# Patient Record
Sex: Female | Born: 1995 | Race: Black or African American | Hispanic: No | Marital: Single | Smoking: Current every day smoker
Health system: Southern US, Community
[De-identification: ages and names within clinical notes are randomized; demographics above are authoritative.]

## PROBLEM LIST (undated history)

## (undated) DIAGNOSIS — J45909 Unspecified asthma, uncomplicated: Secondary | ICD-10-CM

## (undated) DIAGNOSIS — F32A Depression, unspecified: Secondary | ICD-10-CM

## (undated) DIAGNOSIS — F329 Major depressive disorder, single episode, unspecified: Secondary | ICD-10-CM

## (undated) HISTORY — PX: TONSILLECTOMY: SUR1361

## (undated) HISTORY — PX: TYMPANOSTOMY: SHX2586

## (undated) HISTORY — PX: ADENOIDECTOMY: SUR15

---
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
## 2014-06-01 ENCOUNTER — Encounter (HOSPITAL_COMMUNITY): Payer: Self-pay | Admitting: Obstetrics and Gynecology

## 2015-10-10 ENCOUNTER — Emergency Department (HOSPITAL_COMMUNITY): Payer: Medicaid Other

## 2015-10-10 ENCOUNTER — Emergency Department (HOSPITAL_COMMUNITY)
Admission: EM | Admit: 2015-10-10 | Discharge: 2015-10-10 | Disposition: A | Discharge status: Home / Self Care | Payer: Medicaid Other | Attending: Emergency Medicine | Admitting: Emergency Medicine

## 2015-10-10 ENCOUNTER — Encounter (HOSPITAL_COMMUNITY): Payer: Self-pay | Admitting: Emergency Medicine

## 2015-10-10 DIAGNOSIS — R109 Unspecified abdominal pain: Secondary | ICD-10-CM

## 2015-10-10 DIAGNOSIS — Z3202 Encounter for pregnancy test, result negative: Secondary | ICD-10-CM | POA: Insufficient documentation

## 2015-10-10 DIAGNOSIS — Z87891 Personal history of nicotine dependence: Secondary | ICD-10-CM | POA: Diagnosis not present

## 2015-10-10 DIAGNOSIS — R1011 Right upper quadrant pain: Secondary | ICD-10-CM | POA: Diagnosis not present

## 2015-10-10 DIAGNOSIS — J45909 Unspecified asthma, uncomplicated: Secondary | ICD-10-CM | POA: Insufficient documentation

## 2015-10-10 DIAGNOSIS — Z8659 Personal history of other mental and behavioral disorders: Secondary | ICD-10-CM | POA: Diagnosis not present

## 2015-10-10 LAB — COMPREHENSIVE METABOLIC PANEL
ALBUMIN: 3.4 g/dL — AB (ref 3.5–5.0)
ALT: 10 U/L — AB (ref 14–54)
AST: 13 U/L — AB (ref 15–41)
Alkaline Phosphatase: 61 U/L (ref 38–126)
Anion gap: 11 (ref 5–15)
BUN: 8 mg/dL (ref 6–20)
CHLORIDE: 104 mmol/L (ref 101–111)
CO2: 25 mmol/L (ref 22–32)
Calcium: 9 mg/dL (ref 8.9–10.3)
Creatinine, Ser: 0.72 mg/dL (ref 0.44–1.00)
GFR calc Af Amer: >60 mL/min (ref >60)
GFR calc non Af Amer: >60 mL/min (ref >60)
GLUCOSE: 101 mg/dL — AB (ref 65–99)
POTASSIUM: 4.3 mmol/L (ref 3.5–5.1)
Sodium: 140 mmol/L (ref 135–145)
Total Bilirubin: 0.4 mg/dL (ref 0.3–1.2)
Total Protein: 7.2 g/dL (ref 6.5–8.1)

## 2015-10-10 LAB — URINALYSIS, ROUTINE W REFLEX MICROSCOPIC
BILIRUBIN URINE: NEGATIVE
GLUCOSE, UA: NEGATIVE mg/dL
KETONES UR: NEGATIVE mg/dL
Nitrite: NEGATIVE
PH: 6 (ref 5.0–8.0)
Protein, ur: 30 mg/dL — AB
Specific Gravity, Urine: 1.028 (ref 1.005–1.030)

## 2015-10-10 LAB — URINE MICROSCOPIC-ADD ON

## 2015-10-10 LAB — CBC
HEMATOCRIT: 34.1 % — AB (ref 36.0–46.0)
Hemoglobin: 10.4 g/dL — ABNORMAL LOW (ref 12.0–15.0)
MCH: 24.7 pg — AB (ref 26.0–34.0)
MCHC: 30.5 g/dL (ref 30.0–36.0)
MCV: 81 fL (ref 78.0–100.0)
Platelets: 218 10*3/uL (ref 150–400)
RBC: 4.21 MIL/uL (ref 3.87–5.11)
RDW: 14.8 % (ref 11.5–15.5)
WBC: 12.1 10*3/uL — ABNORMAL HIGH (ref 4.0–10.5)

## 2015-10-10 LAB — I-STAT BETA HCG BLOOD, ED (MC, WL, AP ONLY): I-stat hCG, quantitative: <5 m[IU]/mL (ref <5)

## 2015-10-10 LAB — LIPASE, BLOOD: LIPASE: 25 U/L (ref 11–51)

## 2015-10-10 MED ORDER — IOHEXOL 300 MG/ML  SOLN
25.0000 mL | Freq: Once | INTRAMUSCULAR | Status: AC | PRN
  Administered 2015-10-10: 25 mL via ORAL

## 2015-10-10 MED ORDER — IOHEXOL 300 MG/ML  SOLN
100.0000 mL | Freq: Once | INTRAMUSCULAR | Status: AC | PRN
  Administered 2015-10-10: 100 mL via INTRAVENOUS

## 2015-10-10 NOTE — Discharge Instructions (Signed)

## 2015-10-10 NOTE — ED Notes (Signed)
Pt. reports RLQ pain onset last week worse when lying on bed , pt. stated " feels like gas" tried Pepto Bismol with no relief.

## 2015-10-10 NOTE — ED Provider Notes (Signed)
CSN: [id number]     Arrival date & time 10/10/15  0025 History   First MD Initiated Contact with Patient 10/10/15 0720     Chief Complaint  Patient presents with  . Abdominal Pain     (Consider location/radiation/quality/duration/timing/severity/associated sxs/prior Treatment) HPI Comments: Patient here complaining of one week of abdominal pain localized to her right upper right lower quadrant. Pain characterized as sharp and was initially constant and now waxing and waning. No associated fever, vomiting, diarrhea. No vaginal bleeding or discharge. No dysuria or hematuria. Some association with food. Pain is now localized the past when flowers to the right upper quadrant with radiation to her back. Denies any prior history of gallbladder disease. Last menstrual period was 2 weeks ago. No treatment used prior to arrival  Patient is a 20 y.o. female presenting with abdominal pain. The history is provided by the patient.  Abdominal Pain   Past Medical History  Diagnosis Date  . Asthma   . Depression   . Morbid obesity [facility](HCC)    Past Surgical History  Procedure Laterality Date  . Tonsillectomy    . Tympanostomy    . Adenoidectomy    . Cesarean section N/A 10/09/2013    Procedure: CESAREAN SECTION;  Surgeon: Esmeralda ArthurSandra A Rivard, MD;  Location: [facility];  Service: Obstetrics;  Laterality: N/A;   No family history on file. Social History  Substance Use Topics  . Smoking status: Former Smoker    Types: Cigarettes  . Smokeless tobacco: Never Used  . Alcohol Use: No   OB History    Gravida Para Term Preterm AB TAB SAB Ectopic Multiple Living   1 1 1       1      Review of Systems  Gastrointestinal: Positive for abdominal pain.  All other systems reviewed and are negative.     Allergies  Review of patient's allergies indicates no known allergies.  Home Medications   Prior to Admission medications   Medication Sig Start Date End Date Taking? Authorizing Provider  ibuprofen  (ADVIL,MOTRIN) 600 MG tablet Take 1 tablet (600 mg total) by mouth every 6 (six) hours. Patient not taking: Reported on 10/10/2015 10/12/13   Nigel BridgemanVicki Latham, CNM  norgestimate-ethinyl estradiol (SPRINTEC 28) 0.25-35 MG-MCG tablet Take 1 tablet by mouth daily. Patient not taking: Reported on 10/10/2015 10/12/13   Nigel BridgemanVicki Latham, CNM  oxyCODONE-acetaminophen (PERCOCET/ROXICET) 5-325 MG per tablet Take 1-2 tablets by mouth every 4 (four) hours as needed for severe pain (moderate - severe pain). Patient not taking: Reported on 10/10/2015 10/12/13   Nigel BridgemanVicki Latham, CNM   BP 136/79 mmHg  Pulse 106  Temp(Src) 99.1 F (37.3 C) (Oral)  Resp 18  Ht 5\' 7"  (1.702 m)  Wt 149.744 kg  BMI 51.69 kg/m2  SpO2 100% Physical Exam  Constitutional: She is oriented to person, place, and time. She appears well-developed and well-nourished.  Non-toxic appearance. No distress.  HENT:  Head: Normocephalic and atraumatic.  Eyes: Conjunctivae, EOM and lids are normal. Pupils are equal, round, and reactive to light.  Neck: Normal range of motion. Neck supple. No tracheal deviation present. No thyroid mass present.  Cardiovascular: Normal rate, regular rhythm and normal heart sounds.  Exam reveals no gallop.   No murmur heard. Pulmonary/Chest: Effort normal and breath sounds normal. No stridor. No respiratory distress. She has no decreased breath sounds. She has no wheezes. She has no rhonchi. She has no rales.  Abdominal: Soft. Normal appearance and bowel sounds are normal. She exhibits  no distension. There is tenderness in the right upper quadrant. There is no rigidity, no rebound, no guarding and no CVA tenderness.    Musculoskeletal: Normal range of motion. She exhibits no edema or tenderness.  Neurological: She is alert and oriented to person, place, and time. She has normal strength. No cranial nerve deficit or sensory deficit. GCS eye subscore is 4. GCS verbal subscore is 5. GCS motor subscore is 6.  Skin: Skin is warm  and dry. No abrasion and no rash noted.  Psychiatric: She has a normal mood and affect. Her speech is normal and behavior is normal.  Nursing note and vitals reviewed.   ED Course  Procedures (including critical care time) Labs Review Labs Reviewed  COMPREHENSIVE METABOLIC PANEL - Abnormal; Notable for the following:    Glucose, Bld 101 (*)    Albumin 3.4 (*)    AST 13 (*)    ALT 10 (*)    All other components within normal limits  CBC - Abnormal; Notable for the following:    WBC 12.1 (*)    Hemoglobin 10.4 (*)    HCT 34.1 (*)    MCH 24.7 (*)    All other components within normal limits  URINALYSIS, ROUTINE W REFLEX MICROSCOPIC (NOT AT [facility]) - Abnormal; Notable for the following:    APPearance TURBID (*)    Hgb urine dipstick LARGE (*)    Protein, ur 30 (*)    Leukocytes, UA LARGE (*)    All other components within normal limits  URINE MICROSCOPIC-ADD ON - Abnormal; Notable for the following:    Squamous Epithelial / LPF 6-30 (*)    Bacteria, UA MANY (*)    All other components within normal limits  LIPASE, BLOOD  I-STAT BETA HCG BLOOD, ED ([facility], [facility], [facility] ONLY)    Imaging Review No results found. I have personally reviewed and evaluated these images and lab results as part of my medical decision-making.   EKG Interpretation None      MDM   Final diagnoses:  None    Patient is urine likely contaminated. Urine culture will be sent. Abdominal CT as well as pelvic ultrasound negative. Patient's abdominal exam repeated prior to discharge and remains benign. Follow-up instructions as well as return precautions given    Lorre Nick, MD 10/10/15 1224

## 2015-10-12 LAB — URINE CULTURE

## 2016-01-06 ENCOUNTER — Ambulatory Visit (HOSPITAL_COMMUNITY)
Admission: EM | Admit: 2016-01-06 | Discharge: 2016-01-06 | Disposition: A | Discharge status: Home / Self Care | Payer: Medicaid Other | Attending: Emergency Medicine | Admitting: Emergency Medicine

## 2016-01-06 ENCOUNTER — Encounter (HOSPITAL_COMMUNITY): Payer: Self-pay | Admitting: *Deleted

## 2016-01-06 DIAGNOSIS — H7291 Unspecified perforation of tympanic membrane, right ear: Secondary | ICD-10-CM | POA: Diagnosis not present

## 2016-01-06 DIAGNOSIS — R0982 Postnasal drip: Secondary | ICD-10-CM

## 2016-01-06 DIAGNOSIS — H9201 Otalgia, right ear: Secondary | ICD-10-CM | POA: Diagnosis not present

## 2016-01-06 NOTE — ED Provider Notes (Signed)
CSN: [id number]     Arrival date & time 01/06/16  1805 History   First MD Initiated Contact with Patient 01/06/16 1835     Chief Complaint  Patient presents with  . Otalgia   (Consider location/radiation/quality/duration/timing/severity/associated sxs/prior Treatment) HPI Comments: 20 year old obese female complaining of right ear discomfort. She states that yesterday she had some pain in the right ear associated with decreased hearing and draining. Today the ear feels a little better. She is also having PND and minor cough that she is aware is due to allergies.  When the patient was advised of the rupture of the TM she states that she was told by her ENT doctor a couple years ago that this existed.   Past Medical History  Diagnosis Date  . Asthma   . Depression   . Morbid obesity [facility](HCC)    Past Surgical History  Procedure Laterality Date  . Tonsillectomy    . Tympanostomy    . Adenoidectomy    . Cesarean section N/A 10/09/2013    Procedure: CESAREAN SECTION;  Surgeon: Esmeralda ArthurSandra A Rivard, MD;  Location: [facility];  Service: Obstetrics;  Laterality: N/A;   History reviewed. No pertinent family history. Social History  Substance Use Topics  . Smoking status: Former Smoker    Types: Cigarettes  . Smokeless tobacco: Never Used  . Alcohol Use: No   OB History    Gravida Para Term Preterm AB TAB SAB Ectopic Multiple Living   1 1 1       1      Review of Systems  Constitutional: Negative for fever, chills, activity change, appetite change and fatigue.  HENT: Positive for congestion, ear pain, postnasal drip, rhinorrhea and sore throat. Negative for facial swelling.   Eyes: Negative.   Respiratory: Positive for cough. Negative for shortness of breath.   Cardiovascular: Negative.   Musculoskeletal: Negative for neck pain and neck stiffness.  Skin: Negative for pallor and rash.  Neurological: Negative.   All other systems reviewed and are negative.   Allergies  Review of patient's  allergies indicates no known allergies.  Home Medications   Prior to Admission medications   Medication Sig Start Date End Date Taking? Authorizing Provider  ibuprofen (ADVIL,MOTRIN) 600 MG tablet Take 1 tablet (600 mg total) by mouth every 6 (six) hours. Patient not taking: Reported on 10/10/2015 10/12/13   Nigel BridgemanVicki Latham, CNM  norgestimate-ethinyl estradiol (SPRINTEC 28) 0.25-35 MG-MCG tablet Take 1 tablet by mouth daily. Patient not taking: Reported on 10/10/2015 10/12/13   Nigel BridgemanVicki Latham, CNM  oxyCODONE-acetaminophen (PERCOCET/ROXICET) 5-325 MG per tablet Take 1-2 tablets by mouth every 4 (four) hours as needed for severe pain (moderate - severe pain). Patient not taking: Reported on 10/10/2015 10/12/13   Nigel BridgemanVicki Latham, CNM   Meds Ordered and Administered this Visit  Medications - No data to display  BP 155/91 mmHg  Pulse 108  Temp(Src) 99.3 F (37.4 C) (Oral)  Resp 12  SpO2 99%  LMP 12/08/2015 No data found.   Physical Exam  Constitutional: She is oriented to person, place, and time. She appears well-developed and well-nourished. No distress.  HENT:  Mouth/Throat: No oropharyngeal exudate.  Left TM normal. Right TM pearly gray and without erythema. The inferior portion of the TM has a relatively large triangular shape orifice allowing visualization of a portion of the middle ear. There is no current drainage. No swelling of the EAC.  Oropharynx with minor erythema, cobblestoning and clear PND.  Eyes: Conjunctivae are normal. Left eye  exhibits no discharge.  Neck: Normal range of motion. Neck supple.  Cardiovascular: Normal rate and regular rhythm.   Pulmonary/Chest: Effort normal and breath sounds normal. No respiratory distress. She has no wheezes.  Lymphadenopathy:    She has no cervical adenopathy.  Neurological: She is alert and oriented to person, place, and time.  Skin: Skin is warm.  Nursing note and vitals reviewed.   ED Course  Procedures (including critical care  time)  Labs Review Labs Reviewed - No data to display  Imaging Review No results found.   Visual Acuity Review  Right Eye Distance:   Left Eye Distance:   Bilateral Distance:    Right Eye Near:   Left Eye Near:    Bilateral Near:         MDM   1. PND (post-nasal drip)   2. Otalgia of right ear   3. Ruptured tympanic membrane, right    The rupture of the right TM apparently is pre-existing although may have increased recently if there was an infection causing some drainage. Fortunately the ear discomfort has much improved. At this time we will elect to do nothing. I do not think antibiotics are necessary at this time and no eardrops. Recommend taking Allegra, Claritin or Zyrtec as needed for drainage Fluids.    [name], NP 01/06/16 [id number]

## 2016-01-06 NOTE — ED Notes (Signed)
Pt  Reports  Symptoms  Of r  Earache    With  Some  Drainage  From  The  Affected  Ear       She  Reports   She  Has  Had  Symptoms  Of  A  Cold  Recently         And    She  Has  Slight  Discomfort  In the  Affected  Ear     Pt  Is  Awake  And  Alert  As  Well as  Being oriented

## 2016-01-06 NOTE — Discharge Instructions (Signed)
Earache An earache, also called otalgia, can be caused by many things. Pain from an earache can be sharp, dull, or burning. The pain may be temporary or constant. Earaches can be caused by problems with the ear, such as infection in either the middle ear or the ear canal, injury, impacted ear wax, middle ear pressure, or a foreign body in the ear. Ear pain can also result from problems in other areas. This is called referred pain. For example, pain can come from a sore throat, a tooth infection, or problems with the jaw or the joint between the jaw and the skull (temporomandibular joint, or TMJ). The cause of an earache is not always easy to identify. Watchful waiting may be appropriate for some earaches until a clear cause of the pain can be found. HOME CARE INSTRUCTIONS Watch your condition for any changes. The following actions may help to lessen any discomfort that you are feeling:  Take medicines only as directed by your health care provider. This includes ear drops.  Apply ice to your outer ear to help reduce pain.  Put ice in a plastic bag.  Place a towel between your skin and the bag.  Leave the ice on for 20 minutes, 2-3 times per day.  Do not put anything in your ear other than medicine that is prescribed by your health care provider.  Try resting in an upright position instead of lying down. This may help to reduce pressure in the middle ear and relieve pain.  Chew gum if it helps to relieve your ear pain.  Control any allergies that you have.  Keep all follow-up visits as directed by your health care provider. This is important. SEEK MEDICAL CARE IF:  Your pain does not improve within 2 days.  You have a fever.  You have new or worsening symptoms. SEEK IMMEDIATE MEDICAL CARE IF:  You have a severe headache.  You have a stiff neck.  You have difficulty swallowing.  You have redness or swelling behind your ear.  You have drainage from your ear.  You have hearing  loss.  You feel dizzy.   This information is not intended to replace advice given to you by your health care provider. Make sure you discuss any questions you have with your health care provider.   Document Released: 03/03/2004 Document Revised: 08/07/2014 Document Reviewed: 02/15/2014 Elsevier Interactive Patient Education 2016 [employer]  Eardrum Perforation The eardrum is a thin, round tissue inside the ear. It allows you to hear. The eardrum can get torn (perforated). Eardrums often heal on their own. There is often little or no long-term hearing loss. HOME CARE  Keep your ear dry while it heals. Do not let your head go under water. Do not swim or dive until your doctor says it is okay.  Before you take a bath or shower, do one of these things to keep water out of your ear:  Put a waterproof earplug in your ear.  Put petroleum jelly all over a cotton ball. Put the cotton ball in your ear.  Take medicines only as told by your doctor.  Avoid blowing your nose if you can. If you blow your nose, do it gently.  Continue your normal activities after your eardrum heals. Your doctor will tell you when your eardrum has healed.  Talk to your doctor before you fly on an airplane.  Keep all doctor follow-up visits as told by your doctor. This is important. GET HELP IF:  You  have a fever. GET HELP RIGHT AWAY IF:  You have blood or yellowish-white fluid (pus) coming from your ear.  You feel dizzy or off balance.  You feel sick to your stomach (nauseous), or you throw up (vomit).  You have more pain.   This information is not intended to replace advice given to you by your health care provider. Make sure you discuss any questions you have with your health care provider.   Document Released: 01/04/2010 Document Revised: 08/07/2014 Document Reviewed: 02/23/2014 Elsevier Interactive Patient Education 2016 Else[employer] Inc.  Tympanic Membrane Perforation The eardrum (tympanic  membrane) protects the inner ear from the outside environment. In addition to protection, the eardrum allows you to hear by transmitting sound waves to the bones in your ear and then to the nervous system. The tympanic membrane is easily perforated, which may result in damage to the inner ear. SYMPTOMS   Sometimes there are no symptoms.  Decreased hearing.  Fluid drainage from ear.  Ear pain. CAUSES   Most commonly, a middle ear infection from built-up pressure.  Injury from a cotton swab.  Traumatic injury to the side of the head. RISK INCREASES WITH:  Frequent middle ear infections.  Use of cotton swabs. PREVENTION   Do not use cotton swabs to clean the ear canal.  If you have ear pain or pressure, see your caregiver to rule out an ear infection that needs treatment. TREATMENT  Protecting the inner ear and allowing the membrane to heal on its own is how tympanic membrane rupture is usually treated. Healing may take several weeks. In order to protect the inner ear, do not allow any fluid to enter the ear canal. Avoid being submerged in water. The use of ear drops may prevent an ear infection from developing, but they should be used with caution, as ear drops can also cause damage to the inner ear. It is important to follow up with your caregiver to confirm healing of the tympanic membrane. If the membrane does not heal, permanent hearing loss may occur. To avoid serious complications, tympanic membranes that do not heal on their own are repaired with surgery.   This information is not intended to replace advice given to you by your health care provider. Make sure you discuss any questions you have with your health care provider.   Document Released: 07/17/2005 Document Revised: 10/09/2011 Document Reviewed: 01/27/2015 Elsevier Interactive Patient Education [employer]

## 2016-01-12 ENCOUNTER — Encounter (HOSPITAL_COMMUNITY): Payer: Self-pay | Admitting: Emergency Medicine

## 2016-01-12 ENCOUNTER — Ambulatory Visit (HOSPITAL_COMMUNITY)
Admission: EM | Admit: 2016-01-12 | Discharge: 2016-01-12 | Disposition: A | Discharge status: Home / Self Care | Payer: Medicaid Other | Attending: Emergency Medicine | Admitting: Emergency Medicine

## 2016-01-12 DIAGNOSIS — H7291 Unspecified perforation of tympanic membrane, right ear: Secondary | ICD-10-CM | POA: Diagnosis not present

## 2016-01-12 DIAGNOSIS — H6504 Acute serous otitis media, recurrent, right ear: Secondary | ICD-10-CM

## 2016-01-12 MED ORDER — AMOXICILLIN-POT CLAVULANATE 875-125 MG PO TABS: 1.0000 | ORAL_TABLET | Freq: Two times a day (BID) | ORAL | Status: DC

## 2016-01-12 NOTE — ED Notes (Signed)
PT reports she had cold symptoms that started a week ago and they are clearing up. PT began having ear drainage and pain during cold. PT now has continued ear pain and drainage.

## 2016-01-12 NOTE — ED Provider Notes (Signed)
CSN: [id number]     Arrival date & time 01/12/16  1844 History   First MD Initiated Contact with Patient 01/12/16 2011     Chief Complaint  Patient presents with  . Ear Drainage   (Consider location/radiation/quality/duration/timing/severity/associated sxs/prior Treatment) HPI Belinda Brady is a 20 y.o. female presenting to UC with c/o worsening Right ear pain that is moderate in severity, aching and throbbing, associated yellow-clear drainage that is occasionally blood tinged.  She reports having cold symptoms last week. Cough, sore throat and nasal congestion seemed to have resolved but ear pain and drainage keeps worsening.  She was advised she needs to f/u with ENT due to hx of TM perforation, however, she notes her last referral has expired so she needs to go back to her PCP for another referral. Denies fever chills n/v/d.   Past Medical History  Diagnosis Date  . Asthma   . Depression   . Morbid obesity [facility])    Past Surgical History  Procedure Laterality Date  . Tonsillectomy    . Tympanostomy    . Adenoidectomy    . Cesarean section N/A 10/09/2013    Procedure: CESAREAN SECTION;  Surgeon: Esmeralda Arthur, MD;  Location: [facility];  Service: Obstetrics;  Laterality: N/A;   No family history on file. Social History  Substance Use Topics  . Smoking status: Former Smoker    Types: Cigarettes  . Smokeless tobacco: Never Used  . Alcohol Use: No   OB History    Gravida Para Term Preterm AB TAB SAB Ectopic Multiple Living   Review of Systems  Constitutional: Negative for fever and chills.  HENT: Positive for congestion, ear discharge ( Right) and ear pain (Right). Negative for sore throat, trouble swallowing and voice change.   Respiratory: Negative for cough and shortness of breath.   Cardiovascular: Negative for chest pain and palpitations.  Gastrointestinal: Negative for nausea, vomiting, abdominal pain and diarrhea.  Musculoskeletal: Negative for  myalgias, back pain and arthralgias.  Neurological: Negative for dizziness, light-headedness and headaches.    Allergies  Review of patient's allergies indicates no known allergies.  Home Medications   Prior to Admission medications   Medication Sig Start Date End Date Taking? Authorizing Provider  amoxicillin-clavulanate (AUGMENTIN) 875-125 MG tablet Take 1 tablet by mouth every 12 (twelve) hours. 01/12/16   Junius Finner, PA-C  ibuprofen (ADVIL,MOTRIN) 600 MG tablet Take 1 tablet (600 mg total) by mouth every 6 (six) hours. Patient not taking: Reported on 10/10/2015 10/12/13   Nigel Bridgeman, CNM  norgestimate-ethinyl estradiol (SPRINTEC 28) 0.25-35 MG-MCG tablet Take 1 tablet by mouth daily. Patient not taking: Reported on 10/10/2015 10/12/13   Nigel Bridgeman, CNM  oxyCODONE-acetaminophen (PERCOCET/ROXICET) 5-325 MG per tablet Take 1-2 tablets by mouth every 4 (four) hours as needed for severe pain (moderate - severe pain). Patient not taking: Reported on 10/10/2015 10/12/13   Nigel Bridgeman, CNM   Meds Ordered and Administered this Visit  Medications - No data to display  BP 137/89 mmHg  Pulse 80  Temp(Src) 97.9 F (36.6 C) (Oral)  SpO2 100%  LMP 01/11/2016 No data found.   Physical Exam  Constitutional: She is oriented to person, place, and time. She appears well-developed and well-nourished.  HENT:  Head: Normocephalic and atraumatic.  Right Ear: There is drainage. Tympanic membrane is perforated and erythematous.  Left Ear: Tympanic membrane normal.  Nose: Nose normal.  Mouth/Throat: Uvula  is midline, oropharynx is clear and moist and mucous membranes are normal.  Eyes: EOM are normal.  Neck: Normal range of motion. Neck supple.  Cardiovascular: Normal rate, regular rhythm and normal heart sounds.   Pulmonary/Chest: Effort normal and breath sounds normal. No respiratory distress. She has no wheezes. She has no rales.  Musculoskeletal: Normal range of motion.  Neurological: She  is alert and oriented to person, place, and time.  Skin: Skin is warm and dry.  Psychiatric: She has a normal mood and affect. Her behavior is normal.  Nursing note and vitals reviewed.   ED Course  Procedures (including critical care time)  Labs Review Labs Reviewed - No data to display  Imaging Review No results found.    MDM   1. Recurrent acute serous otitis media of right ear   2. Perforated tympanic membrane, right    Exam c/w Right AOM with perforation.  Prior hx of perforation.  Will treat with oral antibiotics.  Rx: Augmentin Encouraged to keep ear covered when showering and to not use any ear drops or Q-tips. Encouraged f/u with PCP for referral to ENT. Patient verbalized understanding and agreement with treatment plan.     Junius Finnerrin O'Malley, PA-C 01/12/16 2033

## 2016-01-12 NOTE — Discharge Instructions (Signed)
You may take 400-600mg  Ibuprofen (Motrin) every 6-8 hours for fever and pain  Alternate with Tylenol  You may take  Tylenol every 4-6 hours as needed for fever and pain  Follow-up with your primary care provider next week for recheck of symptoms if not improving.  Be sure to drink plenty of fluids and rest, at least 8hrs of sleep a night, preferably more while you are sick. Return urgent care or go to closest ER if you cannot keep down fluids/signs of dehydration, fever not reducing with Tylenol, difficulty breathing/wheezing, stiff neck, worsening condition, or other concerns (see below)  Please take antibiotics as prescribed and be sure to complete entire course even if you start to feel better to ensure infection does not come back.   Otitis Media With Effusion Otitis media with effusion is the presence of fluid in the middle ear. This is a common problem in children, which often follows ear infections. It may be present for weeks or longer after the infection. Unlike an acute ear infection, otitis media with effusion refers only to fluid behind the ear drum and not infection. Children with repeated ear and sinus infections and allergy problems are the most likely to get otitis media with effusion. CAUSES  The most frequent cause of the fluid buildup is dysfunction of the eustachian tubes. These are the tubes that drain fluid in the ears to the back of the nose (nasopharynx). SYMPTOMS   The main symptom of this condition is hearing loss. As a result, you or your child may:  Listen to the TV at a loud volume.  Not respond to questions.  Ask "what" often when spoken to.  Mistake or confuse one sound or word for another.  There may be a sensation of fullness or pressure but usually not pain. DIAGNOSIS   Your health care provider will diagnose this condition by examining you or your child's ears.  Your health care provider may test the pressure in you or your child's ear with a  tympanometer.  A hearing test may be conducted if the problem persists. TREATMENT   Treatment depends on the duration and the effects of the effusion.  Antibiotics, decongestants, nose drops, and cortisone-type drugs (tablets or nasal spray) may not be helpful.  Children with persistent ear effusions may have delayed language or behavioral problems. Children at risk for developmental delays in hearing, learning, and speech may require referral to a specialist earlier than children not at risk.  You or your child's health care provider may suggest a referral to an ear, nose, and throat surgeon for treatment. The following may help restore normal hearing:  Drainage of fluid.  Placement of ear tubes (tympanostomy tubes).  Removal of adenoids (adenoidectomy). HOME CARE INSTRUCTIONS   Avoid secondhand smoke.  Infants who are breastfed are less likely to have this condition.  Avoid feeding infants while they are lying flat.  Avoid known environmental allergens.  Avoid people who are sick. SEEK MEDICAL CARE IF:   Hearing is not better in 3 months.  Hearing is worse.  Ear pain.  Drainage from the ear.  Dizziness. MAKE SURE YOU:   Understand these instructions.  Will watch your condition.  Will get help right away if you are not doing well or get worse.   This information is not intended to replace advice given to you by your health care provider. Make sure you discuss any questions you have with your health care provider.   Document Released: 08/24/2004 Document  Revised: 08/07/2014 Document Reviewed: 02/11/2013 Elsevier Interactive Patient Education 2016 Elsevier Inc.  Eardrum Perforation The eardrum is a thin, round tissue inside the ear. It allows you to hear. The eardrum can get torn (perforated). Eardrums often heal on their own. There is often little or no long-term hearing loss. HOME CARE  Keep your ear dry while it heals. Do not let your head go under water.  Do not swim or dive until your doctor says it is okay.  Before you take a bath or shower, do one of these things to keep water out of your ear:  Put a waterproof earplug in your ear.  Put petroleum jelly all over a cotton ball. Put the cotton ball in your ear.  Take medicines only as told by your doctor.  Avoid blowing your nose if you can. If you blow your nose, do it gently.  Continue your normal activities after your eardrum heals. Your doctor will tell you when your eardrum has healed.  Talk to your doctor before you fly on an airplane.  Keep all doctor follow-up visits as told by your doctor. This is important. GET HELP IF:  You have a fever. GET HELP RIGHT AWAY IF:  You have blood or yellowish-white fluid (pus) coming from your ear.  You feel dizzy or off balance.  You feel sick to your stomach (nauseous), or you throw up (vomit).  You have more pain.   This information is not intended to replace advice given to you by your health care provider. Make sure you discuss any questions you have with your health care provider.   Document Released: 01/04/2010 Document Revised: 08/07/2014 Document Reviewed: 02/23/2014 Elsevier Interactive Patient Education [employer] Elsevier Inc.

## 2016-02-07 ENCOUNTER — Encounter (HOSPITAL_COMMUNITY): Payer: Self-pay | Admitting: Nurse Practitioner

## 2016-02-07 ENCOUNTER — Ambulatory Visit (HOSPITAL_COMMUNITY)
Admission: EM | Admit: 2016-02-07 | Discharge: 2016-02-07 | Disposition: A | Discharge status: Home / Self Care | Payer: Medicaid Other | Attending: Emergency Medicine | Admitting: Emergency Medicine

## 2016-02-07 DIAGNOSIS — L02411 Cutaneous abscess of right axilla: Secondary | ICD-10-CM | POA: Diagnosis not present

## 2016-02-07 DIAGNOSIS — F17210 Nicotine dependence, cigarettes, uncomplicated: Secondary | ICD-10-CM | POA: Insufficient documentation

## 2016-02-07 MED ORDER — HYDROCODONE-ACETAMINOPHEN 5-325 MG PO TABS: 1.0000 | ORAL_TABLET | ORAL | Status: DC | PRN

## 2016-02-07 NOTE — Discharge Instructions (Signed)
Abscess °An abscess is an infected area that contains a collection of pus and debris. It can occur in almost any part of the body. An abscess is also known as a furuncle or boil. °CAUSES  °An abscess occurs when tissue gets infected. This can occur from blockage of oil or sweat glands, infection of hair follicles, or a minor injury to the skin. As the body tries to fight the infection, pus collects in the area and creates pressure under the skin. This pressure causes pain. People with weakened immune systems have difficulty fighting infections and get certain abscesses more often.  °SYMPTOMS °Usually an abscess develops on the skin and becomes a painful mass that is red, warm, and tender. If the abscess forms under the skin, you may feel a moveable soft area under the skin. Some abscesses break open (rupture) on their own, but most will continue to get worse without care. The infection can spread deeper into the body and eventually into the bloodstream, causing you to feel ill.  °DIAGNOSIS  °Your caregiver will take your medical history and perform a physical exam. A sample of fluid may also be taken from the abscess to determine what is causing your infection. °TREATMENT  °Your caregiver may prescribe antibiotic medicines to fight the infection. However, taking antibiotics alone usually does not cure an abscess. Your caregiver may need to make a small cut (incision) in the abscess to drain the pus. In some cases, gauze is packed into the abscess to reduce pain and to continue draining the area. °HOME CARE INSTRUCTIONS  °· Only take over-the-counter or prescription medicines for pain, discomfort, or fever as directed by your caregiver. °· If you were prescribed antibiotics, take them as directed. Finish them even if you start to feel better. °· If gauze is used, follow your caregiver's directions for changing the gauze. °· To avoid spreading the infection: °· Keep your draining abscess covered with a  bandage. °· Wash your hands well. °· Do not share personal care items, towels, or whirlpools with others. °· Avoid skin contact with others. °· Keep your skin and clothes clean around the abscess. °· Keep all follow-up appointments as directed by your caregiver. °SEEK MEDICAL CARE IF:  °· You have increased pain, swelling, redness, fluid drainage, or bleeding. °· You have muscle aches, chills, or a general ill feeling. °· You have a fever. °MAKE SURE YOU:  °· Understand these instructions. °· Will watch your condition. °· Will get help right away if you are not doing well or get worse. °  °This information is not intended to replace advice given to you by your health care provider. Make sure you discuss any questions you have with your health care provider. °  °Document Released: 04/26/2005 Document Revised: 01/16/2012 Document Reviewed: 09/29/2011 °Elsevier Interactive Patient Education ©2016 Elsevier Inc. ° °Incision and Drainage °Incision and drainage is a procedure in which a sac-like structure (cystic structure) is opened and drained. The area to be drained usually contains material such as pus, fluid, or blood.  °LET YOUR CAREGIVER KNOW ABOUT:  °· Allergies to medicine. °· Medicines taken, including vitamins, herbs, eyedrops, over-the-counter medicines, and creams. °· Use of steroids (by mouth or creams). °· Previous problems with anesthetics or numbing medicines. °· History of bleeding problems or blood clots. °· Previous surgery. °· Other health problems, including diabetes and kidney problems. °· Possibility of pregnancy, if this applies. °RISKS AND COMPLICATIONS °· Pain. °· Bleeding. °· Scarring. °· Infection. °BEFORE THE PROCEDURE  °  You may need to have an ultrasound or other imaging tests to see how large or deep your cystic structure is. Blood tests may also be used to determine if you have an infection or how severe the infection is. You may need to have a tetanus shot. °PROCEDURE  °The affected area  is cleaned with a cleaning fluid. The cyst area will then be numbed with a medicine (local anesthetic). A small incision will be made in the cystic structure. A syringe or catheter may be used to drain the contents of the cystic structure, or the contents may be squeezed out. The area will then be flushed with a cleansing solution. After cleansing the area, it is often gently packed with a gauze or another wound dressing. Once it is packed, it will be covered with gauze and tape or some other type of wound dressing.  °AFTER THE PROCEDURE  °· Often, you will be allowed to go home right after the procedure. °· You may be given antibiotic medicine to prevent or heal an infection. °· If the area was packed with gauze or some other wound dressing, you will likely need to come back in 1 to 2 days to get it removed. °· The area should heal in about 14 days. °  °This information is not intended to replace advice given to you by your health care provider. Make sure you discuss any questions you have with your health care provider. °  °Document Released: 01/10/2001 Document Revised: 01/16/2012 Document Reviewed: 09/11/2011 °Elsevier Interactive Patient Education ©2016 Elsevier Inc. ° °

## 2016-02-07 NOTE — ED Notes (Signed)
Pt reports 1 week history of painful abscess under R armpit. She has been applying warm compresses but pain is increasing. Denies any drainage.

## 2016-02-07 NOTE — ED Provider Notes (Signed)
CSN: [id number]     Arrival date & time 02/07/16  1643 History   First MD Initiated Contact with Patient 02/07/16 1701     Chief Complaint  Patient presents with  . Wound Infection   (Consider location/radiation/quality/duration/timing/severity/associated sxs/prior Treatment) HPI Comments: 20 year old female presents with complaints of a painful boil to the right axilla. She states it started 1 week ago. She has been applying warm compresses but is continues to become painful and larger.   Past Medical History  Diagnosis Date  . Asthma   . Depression   . Morbid obesity [facility]    Past Surgical History  Procedure Laterality Date  . Tonsillectomy    . Tympanostomy    . Adenoidectomy    . Cesarean section N/A 10/09/2013    Procedure: CESAREAN SECTION;  Surgeon: Esmeralda Arthur, MD;  Location: [facility];  Service: Obstetrics;  Laterality: N/A;   History reviewed. No pertinent family history. Social History  Substance Use Topics  . Smoking status: Current Every Day Smoker    Types: Cigarettes  . Smokeless tobacco: Never Used  . Alcohol Use: No   OB History    Gravida Para Term Preterm AB TAB SAB Ectopic Multiple Living   Review of Systems  Constitutional: Negative.   Respiratory: Negative.   Skin:       As per history of present illness  Neurological: Negative.   Psychiatric/Behavioral: Negative.   All other systems reviewed and are negative.   Allergies  Review of patient's allergies indicates no known allergies.  Home Medications   Prior to Admission medications   Medication Sig Start Date End Date Taking? Authorizing Provider  amoxicillin-clavulanate (AUGMENTIN) 875-125 MG tablet Take 1 tablet by mouth every 12 (twelve) hours. 01/12/16   Junius Finner, PA-C  HYDROcodone-acetaminophen (NORCO/VICODIN) 5-325 MG tablet Take 1 tablet by mouth every 4 (four) hours as needed. 02/07/16   [name], NP   Meds Ordered and Administered this Visit  Medications  - No data to display  BP 111/79 mmHg  Pulse 110  Temp(Src) 98.7 F (37.1 C) (Oral)  Resp 16  SpO2 99%  LMP 01/16/2016 No data found.   Physical Exam  Constitutional: She is oriented to person, place, and time. She appears well-developed and well-nourished. No distress.  Eyes: EOM are normal.  Neck: Normal range of motion. Neck supple.  Cardiovascular: Normal rate.   Pulmonary/Chest: Effort normal. No respiratory distress.  Musculoskeletal: She exhibits no edema.  Neurological: She is alert and oriented to person, place, and time. She exhibits normal muscle tone.  Skin: Skin is warm and dry.  5 x 3 cm abscess to the right axilla. Tenderness to the lesion as well as the surrounding skin. The lesion is well marginated with underlying induration within the subcutaneous tissue. No cellulitis.  Psychiatric: She has a normal mood and affect.  Nursing note and vitals reviewed.   ED Course  .Marland KitchenIncision and Drainage Date/Time: 02/07/2016 6:10 PM Performed by: Phineas Real, Clara Herbison Authorized by: Charm Rings Consent: Verbal consent obtained. Risks and benefits: risks, benefits and alternatives were discussed Consent given by: patient Patient understanding: patient states understanding of the procedure being performed Patient identity confirmed: verbally with patient Type: abscess Body area: upper extremity Location details: right arm Anesthesia: local infiltration Local anesthetic: lidocaine 2% without epinephrine Anesthetic total: 9 ml Patient sedated: no Scalpel size: 11 Incision type: single with marsupialization Incision depth: subcutaneous  Complexity: complex Drainage: purulent Drainage amount: copious Wound treatment: drain placed Packing material: none Patient tolerance: Patient tolerated the procedure well with no immediate complications Comments: Dressing applied.   (including critical care time)  Labs Review Labs Reviewed  AEROBIC CULTURE (SUPERFICIAL SPECIMEN)     Imaging Review No results found.   Visual Acuity Review  Right Eye Distance:   Left Eye Distance:   Bilateral Distance:    Right Eye Near:   Left Eye Near:    Bilateral Near:         MDM   1. Abscess of axilla, right    I&D performed. Culture pending. Apply dressing. No antibiotics at this time. Return in 2 days for wound check and packing removal. For worsening or new symptoms in the meantime may return. Meds ordered this encounter  Medications  . HYDROcodone-acetaminophen (NORCO/VICODIN) 5-325 MG tablet    Sig: Take 1 tablet by mouth every 4 (four) hours as needed.    Dispense:  12 tablet    Refill:  0    Order Specific Question:  Supervising Provider    Answer:  Micheline ChapmanHONIG, ERIN J [[id number]]       Hayden Rasmussenavid Tarica Harl, NP 02/07/16 [phone]

## 2016-02-10 LAB — AEROBIC CULTURE W GRAM STAIN (SUPERFICIAL SPECIMEN)

## 2016-02-10 LAB — AEROBIC CULTURE  (SUPERFICIAL SPECIMEN)

## 2016-02-12 ENCOUNTER — Ambulatory Visit (HOSPITAL_COMMUNITY)
Admission: EM | Admit: 2016-02-12 | Discharge: 2016-02-12 | Disposition: A | Discharge status: Home / Self Care | Payer: Medicaid Other | Attending: Family Medicine | Admitting: Family Medicine

## 2016-02-12 ENCOUNTER — Encounter (HOSPITAL_COMMUNITY): Payer: Self-pay | Admitting: Emergency Medicine

## 2016-02-12 DIAGNOSIS — Z5189 Encounter for other specified aftercare: Secondary | ICD-10-CM

## 2016-02-12 DIAGNOSIS — Z4801 Encounter for change or removal of surgical wound dressing: Secondary | ICD-10-CM

## 2016-02-12 NOTE — ED Notes (Signed)
Patient reports having a wound lanced and packed.  Here today for packing removal.  Site is under right arm

## 2016-02-12 NOTE — ED Provider Notes (Signed)
CSN: [id number]     Arrival date & time 02/12/16  1527 History   First MD Initiated Contact with Patient 02/12/16 1736     Chief Complaint  Patient presents with  . Wound Check   (Consider location/radiation/quality/duration/timing/severity/associated sxs/prior Treatment) HPI Here to follow up on right axilla wound s/p I& D 5-6 days ago. She denies pain, no fever, no discharge from the area. She stated she had it packed and the packing is uncomfortable and will like it out. Denies any other concern.   Past Medical History  Diagnosis Date  . Asthma   . Depression   . Morbid obesity [facility])    Past Surgical History  Procedure Laterality Date  . Tonsillectomy    . Tympanostomy    . Adenoidectomy    . Cesarean section N/A 10/09/2013    Procedure: CESAREAN SECTION;  Surgeon: Esmeralda ArthurSandra A Rivard, MD;  Location: [facility];  Service: Obstetrics;  Laterality: N/A;   No family history on file. Social History  Substance Use Topics  . Smoking status: Current Every Day Smoker    Types: Cigarettes  . Smokeless tobacco: Never Used  . Alcohol Use: No   OB History    Gravida Para Term Preterm AB TAB SAB Ectopic Multiple Living   1 1 1       1      Review of Systems  Respiratory: Negative.   Cardiovascular: Negative.   Gastrointestinal: Negative.   Skin: Positive for wound.  All other systems reviewed and are negative.   Allergies  Review of patient's allergies indicates no known allergies.  Home Medications   Prior to Admission medications   Medication Sig Start Date End Date Taking? Authorizing Provider  amoxicillin-clavulanate (AUGMENTIN) 875-125 MG tablet Take 1 tablet by mouth every 12 (twelve) hours. Patient not taking: Reported on 02/12/2016 01/12/16   Junius FinnerErin O'Malley, PA-C  HYDROcodone-acetaminophen (NORCO/VICODIN) 5-325 MG tablet Take 1 tablet by mouth every 4 (four) hours as needed. 02/07/16   Hayden Rasmussenavid Mabe, NP   Meds Ordered and Administered this Visit  Medications - No data to  display  BP 125/73 mmHg  Pulse 76  Temp(Src) 98.6 F (37 C) (Oral)  Resp 22  SpO2 98%  LMP 02/09/2016 No data found.   Physical Exam  Constitutional: She appears well-developed. No distress.  Cardiovascular: Normal rate, regular rhythm, normal heart sounds and intact distal pulses.   No murmur heard. Pulmonary/Chest: Effort normal and breath sounds normal. No respiratory distress. She has no wheezes.  Skin:     Nursing note and vitals reviewed.   ED Course  Procedures (including critical care time)  Labs Review Labs Reviewed - No data to display  Imaging Review No results found.   Visual Acuity Review  Right Eye Distance:   Left Eye Distance:   Bilateral Distance:    Right Eye Near:   Left Eye Near:    Bilateral Near:         MDM  No diagnosis found. Wound check, abscess  S/P I&D few days ago. Area looks clean, no sign of infection. Packing removed, she declined repacking. Area cleaned with betadine and covered with 2 by 2 gauze. Tylenol as needed for pain. Keep area clean and dry. Return precaution discussed.    Doreene ElandKehinde T Eniola, MD 02/12/16 1750

## 2016-02-12 NOTE — Discharge Instructions (Signed)
Wound Care °Taking care of your wound properly can help to prevent pain and infection. It can also help your wound to heal more quickly.  °HOW TO CARE FOR YOUR WOUND  °· Take or apply over-the-counter and prescription medicines only as told by your health care provider. °· If you were prescribed antibiotic medicine, take or apply it as told by your health care provider. Do not stop using the antibiotic even if your condition improves. °· Clean the wound each day or as told by your health care provider. °¨ Wash the wound with mild soap and water. °¨ Rinse the wound with water to remove all soap. °¨ Pat the wound dry with a clean towel. Do not rub it. °· There are many different ways to close and cover a wound. For example, a wound can be covered with stitches (sutures), skin glue, or adhesive strips. Follow instructions from your health care provider about: °¨ How to take care of your wound. °¨ When and how you should change your bandage (dressing). °¨ When you should remove your dressing. °¨ Removing whatever was used to close your wound. °· Check your wound every day for signs of infection. Watch for: °¨ Redness, swelling, or pain. °¨ Fluid, blood, or pus. °· Keep the dressing dry until your health care provider says it can be removed. Do not take baths, swim, use a hot tub, or do anything that would put your wound underwater until your health care provider approves. °· Raise (elevate) the injured area above the level of your heart while you are sitting or lying down. °· Do not scratch or pick at the wound. °· Keep all follow-up visits as told by your health care provider. This is important. °SEEK MEDICAL CARE IF: °· You received a tetanus shot and you have swelling, severe pain, redness, or bleeding at the injection site. °· You have a fever. °· Your pain is not controlled with medicine. °· You have increased redness, swelling, or pain at the site of your wound. °· You have fluid, blood, or pus coming from your  wound. °· You notice a bad smell coming from your wound or your dressing. °SEEK IMMEDIATE MEDICAL CARE IF: °· You have a red streak going away from your wound. °  °This information is not intended to replace advice given to you by your health care provider. Make sure you discuss any questions you have with your health care provider. °  °Document Released: 04/25/2008 Document Revised: 12/01/2014 Document Reviewed: 07/13/2014 °Elsevier Interactive Patient Education ©2016 Elsevier Inc. ° °

## 2016-02-12 NOTE — ED Notes (Signed)
Patient not in treatment room 

## 2016-02-14 ENCOUNTER — Telehealth (HOSPITAL_COMMUNITY): Payer: Self-pay | Admitting: Emergency Medicine

## 2016-02-14 NOTE — Telephone Encounter (Signed)
LM on pt's VM [phone] Need to give lab results from recent visit on 7/10 Also let pt know labs can be obtained from MyChart  Was also seen on 7/15 for a f/u  Per Dr. Dayton ScrapeMurray,   Notes Recorded by Eustace MooreLaura W Murray, MD on 02/10/2016 at 10:26 PM Clinical staff, please let patient know that axillary abscess was positive for a few MRSA. [facility] If redness/swelling/pain/drainage are improving, no abx needed. If redness/swelling/pain/drainage are worsening or not improving, or if there is new fever >100.5, would send rx for bactrim DS 1 tab bid x 7d, #14 no refills. Recheck as needed. LM

## 2016-07-27 ENCOUNTER — Ambulatory Visit (HOSPITAL_COMMUNITY)
Admission: EM | Admit: 2016-07-27 | Discharge: 2016-07-27 | Disposition: A | Discharge status: Home / Self Care | Payer: Self-pay | Attending: Family Medicine | Admitting: Family Medicine

## 2016-07-27 ENCOUNTER — Encounter (HOSPITAL_COMMUNITY): Payer: Self-pay | Admitting: Emergency Medicine

## 2016-07-27 DIAGNOSIS — R0982 Postnasal drip: Secondary | ICD-10-CM

## 2016-07-27 DIAGNOSIS — J9801 Acute bronchospasm: Secondary | ICD-10-CM

## 2016-07-27 DIAGNOSIS — Z720 Tobacco use: Secondary | ICD-10-CM

## 2016-07-27 MED ORDER — METHYLPREDNISOLONE ACETATE 40 MG/ML IJ SUSP
40.0000 mg | Freq: Once | INTRAMUSCULAR | Status: AC
  Administered 2016-07-27: 40 mg via INTRAMUSCULAR

## 2016-07-27 MED ORDER — DEXAMETHASONE SODIUM PHOSPHATE 10 MG/ML IJ SOLN
INTRAMUSCULAR | Status: AC
  Filled 2016-07-27: qty 1

## 2016-07-27 MED ORDER — METHYLPREDNISOLONE ACETATE 40 MG/ML IJ SUSP
INTRAMUSCULAR | Status: AC
  Filled 2016-07-27: qty 1

## 2016-07-27 MED ORDER — ALBUTEROL SULFATE (2.5 MG/3ML) 0.083% IN NEBU
2.5000 mg | INHALATION_SOLUTION | Freq: Once | RESPIRATORY_TRACT | Status: AC
  Administered 2016-07-27: 2.5 mg via RESPIRATORY_TRACT

## 2016-07-27 MED ORDER — PREDNISONE 50 MG PO TABS: ORAL_TABLET | ORAL | 0 refills | Status: DC

## 2016-07-27 MED ORDER — IPRATROPIUM-ALBUTEROL 0.5-2.5 (3) MG/3ML IN SOLN
RESPIRATORY_TRACT | Status: AC
  Filled 2016-07-27: qty 3

## 2016-07-27 MED ORDER — DEXAMETHASONE SODIUM PHOSPHATE 10 MG/ML IJ SOLN
10.0000 mg | Freq: Once | INTRAMUSCULAR | Status: AC
  Administered 2016-07-27: 10 mg via INTRAMUSCULAR

## 2016-07-27 MED ORDER — ALBUTEROL SULFATE (2.5 MG/3ML) 0.083% IN NEBU
INHALATION_SOLUTION | RESPIRATORY_TRACT | Status: AC
  Filled 2016-07-27: qty 3

## 2016-07-27 MED ORDER — IPRATROPIUM-ALBUTEROL 0.5-2.5 (3) MG/3ML IN SOLN
3.0000 mL | Freq: Once | RESPIRATORY_TRACT | Status: AC
  Administered 2016-07-27: 3 mL via RESPIRATORY_TRACT

## 2016-07-27 MED ORDER — ALBUTEROL SULFATE HFA 108 (90 BASE) MCG/ACT IN AERS: 2.0000 | INHALATION_SPRAY | RESPIRATORY_TRACT | 0 refills | Status: DC | PRN

## 2016-07-27 NOTE — ED Provider Notes (Signed)
CSN: [id number]     Arrival date & time 07/27/16  1725 History   First MD Initiated Contact with Patient 07/27/16 1923     Chief Complaint  Patient presents with  . URI   (Consider location/radiation/quality/duration/timing/severity/associated sxs/prior Treatment) 20 year old obese female with a history of asthma as a child and current smoker presents with chest congestion, dry cough and runny nose for the past 2-3 days.      Past Medical History:  Diagnosis Date  . Asthma   . Depression   . Morbid obesity (HCC)    Past Surgical History:  Procedure Laterality Date  . ADENOIDECTOMY    . CESAREAN SECTION N/A 10/09/2013   Procedure: CESAREAN SECTION;  Surgeon: Esmeralda ArthurSandra A Rivard, MD;  Location: [facility];  Service: Obstetrics;  Laterality: N/A;  . TONSILLECTOMY    . TYMPANOSTOMY     History reviewed. No pertinent family history. Social History  Substance Use Topics  . Smoking status: Current Every Day Smoker    Types: Cigars  . Smokeless tobacco: Never Used  . Alcohol use Yes   OB History    Gravida Para Term Preterm AB Living   1 1 1     1    SAB TAB Ectopic Multiple Live Births           1     Review of Systems  Constitutional: Negative.  Negative for fever.  HENT: Positive for rhinorrhea. Negative for ear pain and sore throat.   Respiratory: Positive for cough and shortness of breath.   Gastrointestinal: Negative.   Genitourinary: Negative.   Neurological: Negative.   All other systems reviewed and are negative.   Allergies  Patient has no known allergies.  Home Medications   Prior to Admission medications   Medication Sig Start Date End Date Taking? Authorizing Provider  albuterol (PROVENTIL HFA;VENTOLIN HFA) 108 (90 Base) MCG/ACT inhaler Inhale 2 puffs into the lungs every 4 (four) hours as needed for wheezing or shortness of breath. 07/27/16   Hayden Rasmussenavid Zuley Lutter, NP  predniSONE (DELTASONE) 50 MG tablet 1 tab po daily for 6 days. Start evening on 07/28/2016. Take with  food. 07/27/16   Hayden Rasmussenavid Mehak Roskelley, NP   Meds Ordered and Administered this Visit   Medications  ipratropium-albuterol (DUONEB) 0.5-2.5 (3) MG/3ML nebulizer solution 3 mL (3 mLs Nebulization Given 07/27/16 1948)  albuterol (PROVENTIL) (2.5 MG/3ML) 0.083% nebulizer solution 2.5 mg (2.5 mg Nebulization Given 07/27/16 1948)  dexamethasone (DECADRON) injection 10 mg (10 mg Intramuscular Given 07/27/16 1948)  methylPREDNISolone acetate (DEPO-MEDROL) injection 40 mg (40 mg Intramuscular Given 07/27/16 1947)    BP 138/80 (BP Location: Left Arm)   Pulse 77   Temp 97.9 F (36.6 C) (Oral)   Resp 18   LMP 07/06/2016   SpO2 99%   Breastfeeding? No  No data found.   Physical Exam  Constitutional: She is oriented to person, place, and time. She appears well-developed and well-nourished. No distress.  HENT:  Head: Normocephalic and atraumatic.  Right Ear: External ear normal.  Left Ear: External ear normal.  Mouth/Throat: Oropharynx is clear and moist.  Eyes: EOM are normal. Pupils are equal, round, and reactive to light.  Neck: Normal range of motion. Neck supple.  Cardiovascular: Normal rate, regular rhythm and normal heart sounds.   Pulmonary/Chest: Effort normal. No respiratory distress. She has wheezes.  Prolonged inspiratory and expiratory phases. Inspiratory and expiratory wheezing and coarseness. Air movement fair. Appearance of respirations being even and nonlabored at rest. Patient smiling and speaking  in complete sentences.  Musculoskeletal: Normal range of motion. She exhibits no edema.  Lymphadenopathy:    She has no cervical adenopathy.  Neurological: She is alert and oriented to person, place, and time.  Skin: Skin is warm and dry.  Psychiatric: She has a normal mood and affect.  Nursing note and vitals reviewed.   Urgent Care Course   Clinical Course     Procedures (including critical care time)  Labs Review Labs Reviewed - No data to display  Imaging Review No results  found.   Visual Acuity Review  Right Eye Distance:   Left Eye Distance:   Bilateral Distance:    Right Eye Near:   Left Eye Near:    Bilateral Near:         MDM   1. PND (post-nasal drip)   2. Cough due to bronchospasm   3. Tobacco abuse disorder    Post DuoNeb and steroid injections the patient had moderate improvement in air movement and decrease in wheezing and coarseness. She states she was breathing better. She continues to have some wheezing. She is given medications for her to   use for her symptoms and breathing. Follow-up with your doctor or return or go to emergency department for worsening.  Recommend taking an antihistamine such as Allegra or Zyrtec daily for drainage and runny nose. Started using the albuterol inhaler 2 puffs every 4 hours this evening. Start taking the prednisone tomorrow evening. Intake with food. Stop smoking. For worsening, new symptoms or problems return or go to the emergency department. Meds ordered this encounter  Medications  . ipratropium-albuterol (DUONEB) 0.5-2.5 (3) MG/3ML nebulizer solution 3 mL  . albuterol (PROVENTIL) (2.5 MG/3ML) 0.083% nebulizer solution 2.5 mg  . dexamethasone (DECADRON) injection 10 mg  . methylPREDNISolone acetate (DEPO-MEDROL) injection 40 mg  . albuterol (PROVENTIL HFA;VENTOLIN HFA) 108 (90 Base) MCG/ACT inhaler    Sig: Inhale 2 puffs into the lungs every 4 (four) hours as needed for wheezing or shortness of breath.    Dispense:  1 Inhaler    Refill:  0    Order Specific Question:   Supervising Provider    Answer:   Bradd CanaryKINDL, JAMES D [id number]]  . predniSONE (DELTASONE) 50 MG tablet    Sig: 1 tab po daily for 6 days. Start evening on 07/28/2016. Take with food.    Dispense:  6 tablet    Refill:  0    Order Specific Question:   Supervising Provider    Answer:   Linna HoffKINDL, JAMES D [[id number]]      Hayden Rasmussenavid Vibha Ferdig, NP 07/27/16 2028

## 2016-07-27 NOTE — ED Triage Notes (Signed)
Here for cold sx onset yest associated w/chest congestion, dry cough  Denies fevers  A&O X4... NAD

## 2016-07-27 NOTE — Discharge Instructions (Signed)
Recommend taking an antihistamine such as Allegra or Zyrtec daily for drainage and runny nose. Started using the albuterol inhaler 2 puffs every 4 hours this evening. Start taking the prednisone tomorrow evening. Intake with food. Stop smoking. For worsening, new symptoms or problems return or go to the emergency department.

## 2017-05-14 IMAGING — CT CT ABD-PELV W/ CM
2 of 4 series · 17 of 46 positions shown, 19 images · IV contrast (omnipaque)
Comparison: None.

CLINICAL DATA: Abdominal pain for 1 week in the lower abdomen

EXAM:
CT ABDOMEN AND PELVIS WITH CONTRAST
TECHNIQUE: Multidetector CT imaging of the abdomen and pelvis was performed
using the standard protocol following bolus administration of
intravenous contrast.
CONTRAST:  100mL OMNIPAQUE IOHEXOL 300 MG/ML  SOLN

[Series 2: a/p w/ 5mm · axial · 0.96mm/px · z∈[+1032,+1492]mm · 14 of 102 slices shown, 16 images]
[im 5/102  soft-tissue]
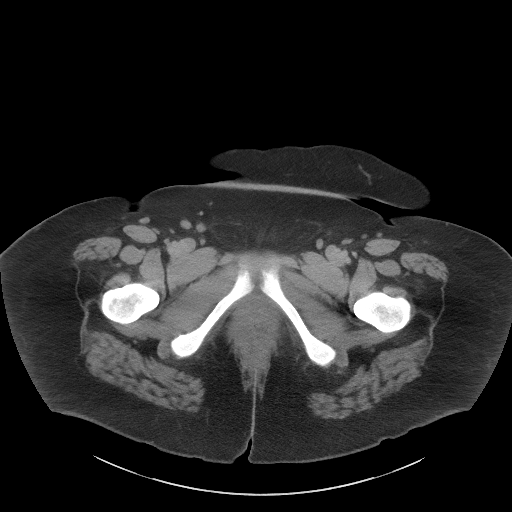
[im 5/102  bone]
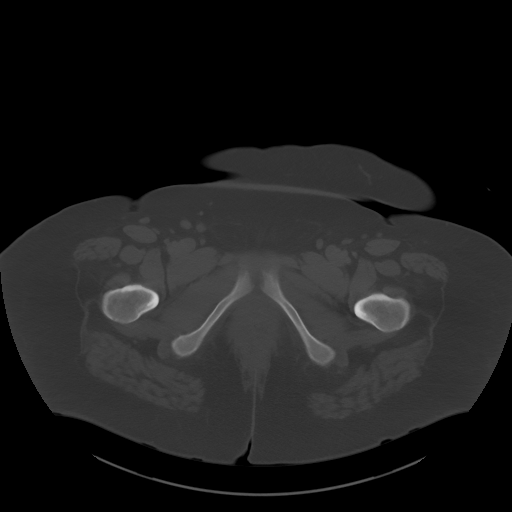
[im 14/102  soft-tissue]
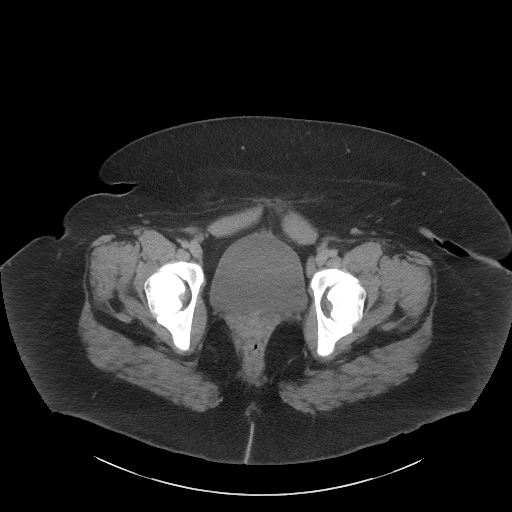
[im 18/102  soft-tissue]
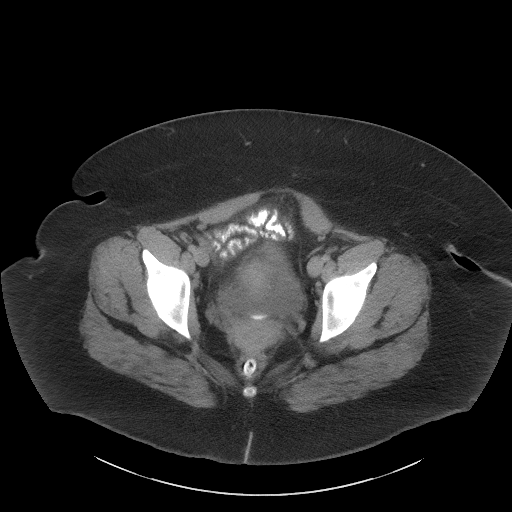
[im 27/102  soft-tissue]
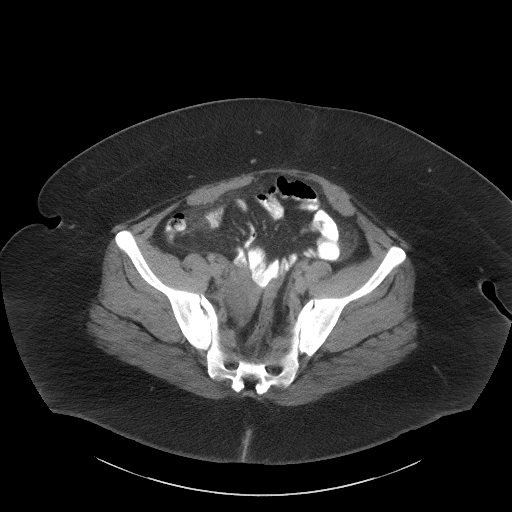
[im 36/102  soft-tissue]
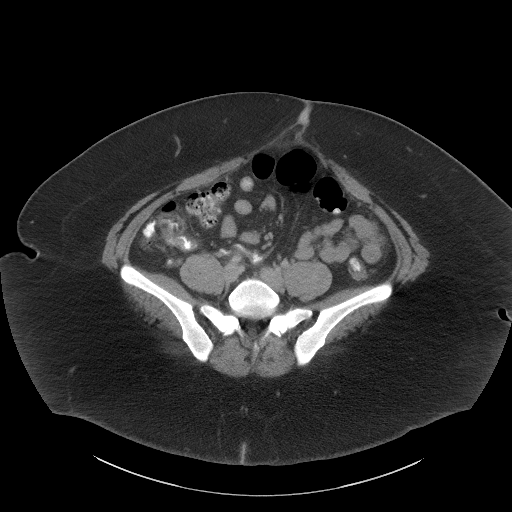
[im 40/102  soft-tissue]
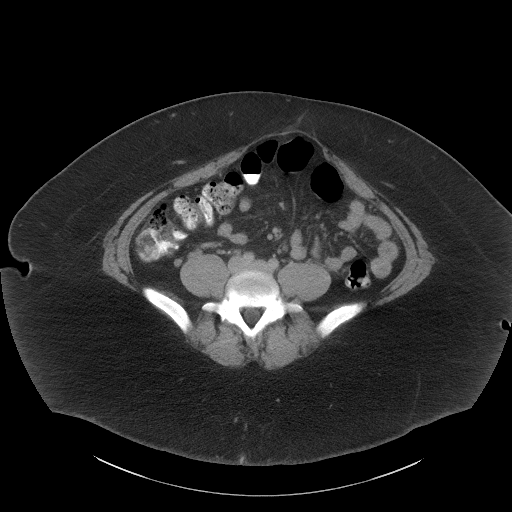
[im 49/102  soft-tissue]
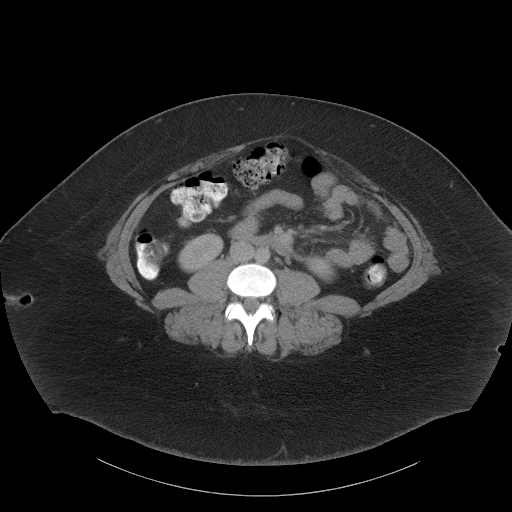
[im 53/102  soft-tissue]
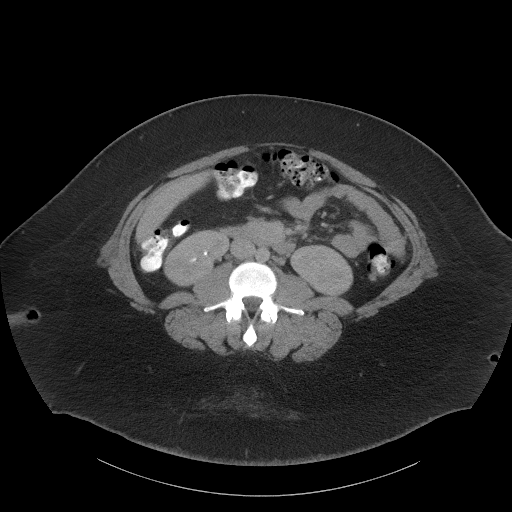
[im 62/102  soft-tissue]
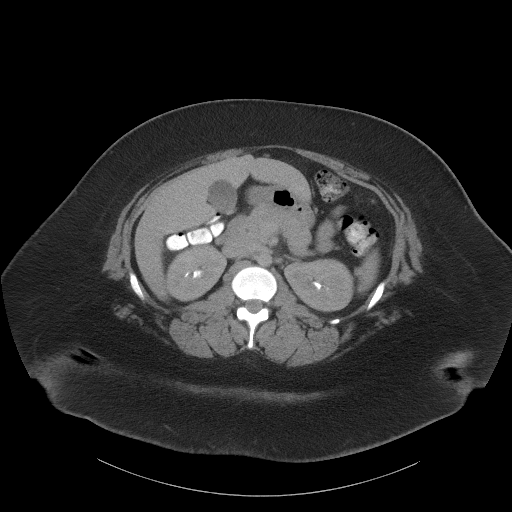
[im 62/102  bone]
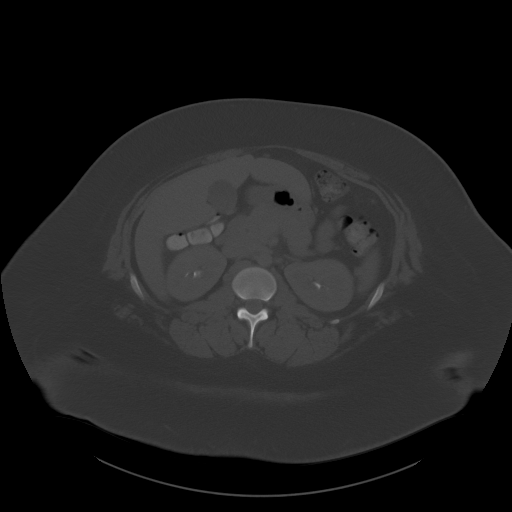
[im 66/102  soft-tissue]
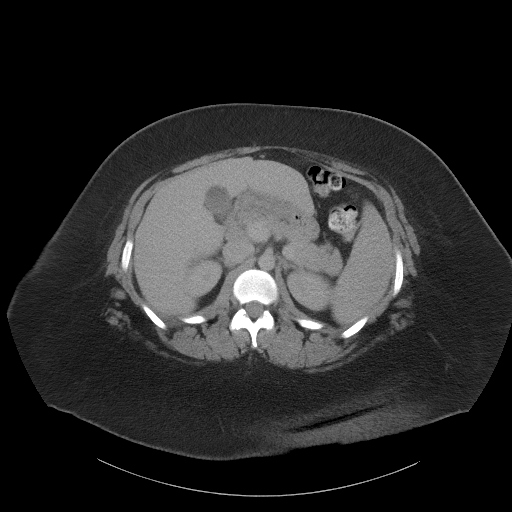
[im 75/102  soft-tissue]
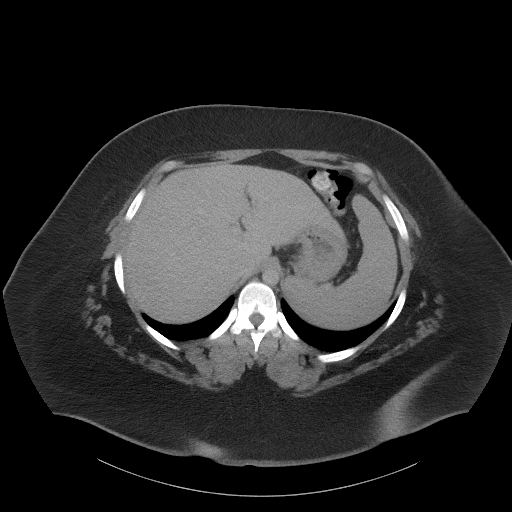
[im 84/102  soft-tissue]
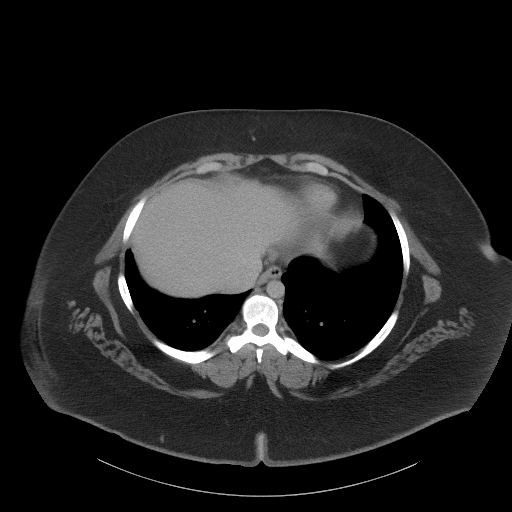
[im 88/102  soft-tissue]
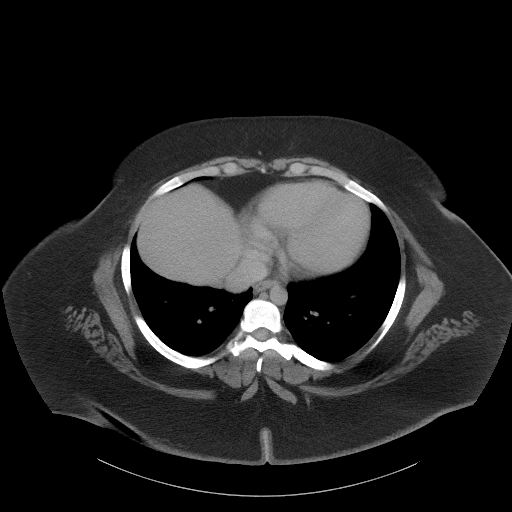
[im 97/102  soft-tissue]
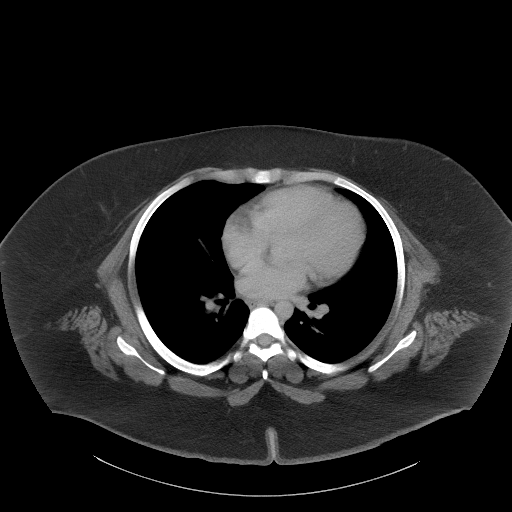

[Series 5: a/p w/ cor · coronal · 0.99mm/px · 3 of 184 slices shown]
[im 62/184  soft-tissue]
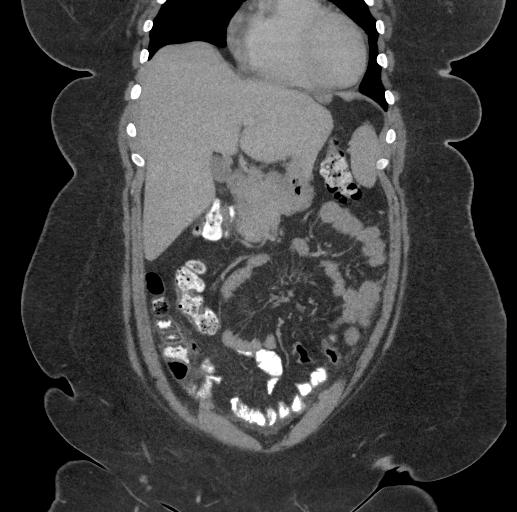
[im 82/184  soft-tissue]
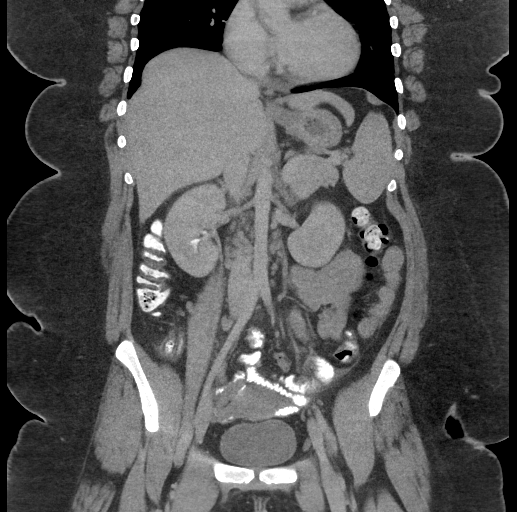
[im 102/184  soft-tissue]
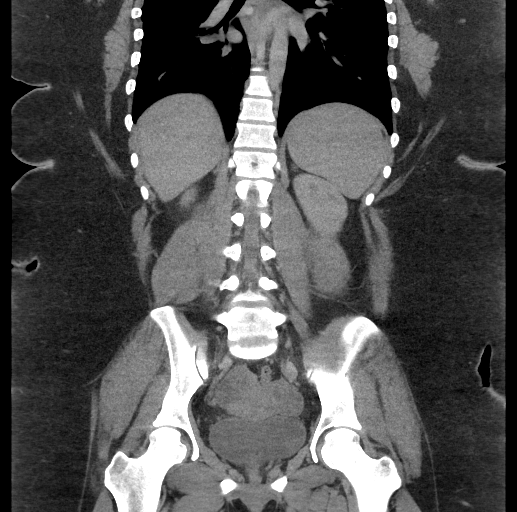

[17 of 46 positions shown; findings below may reference images not displayed]

FINDINGS: Lower chest: Lung bases are clear. No effusions. Heart is normal
size.

Hepatobiliary: Gallbladder normal.  Liver unremarkable.

Pancreas: Normal

Spleen: Normal

Adrenals/Urinary Tract: Normal

Stomach/Bowel: Stomach, large and small bowel grossly unremarkable.
Appendix is normal

Vascular/Lymphatic: No evidence of aneurysm or adenopathy.

Reproductive: Uterus and adnexa unremarkable.  No mass.

Other: No free fluid or free air

Musculoskeletal: No acute bony abnormality or focal bone lesion
IMPRESSION: No acute findings in the abdomen or pelvis.

## 2017-08-09 IMAGING — US US ABDOMEN COMPLETE
1 series · 14 of 25 positions shown · non-contrast
Comparison: None.

CLINICAL DATA: Right upper quadrant abdominal pain.

EXAM:
ABDOMEN ULTRASOUND COMPLETE

[Series 1: us abdomen complete · 0.25mm/px · 14 of 78 slices shown]
[im 1/78]
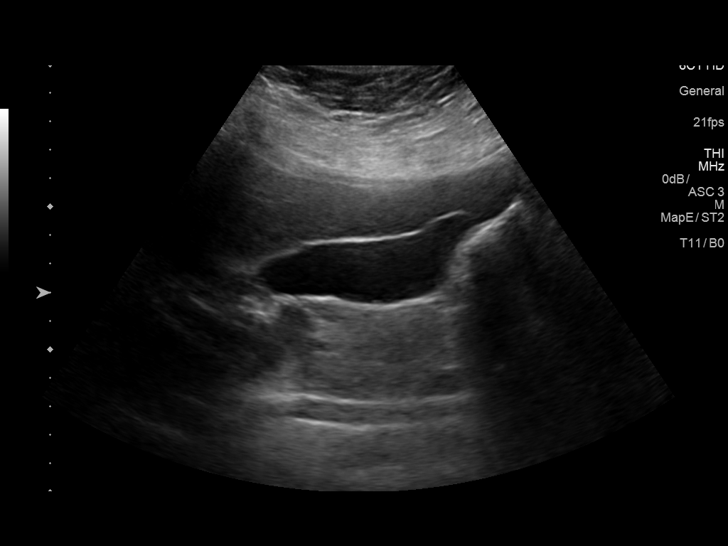
[im 7/78]
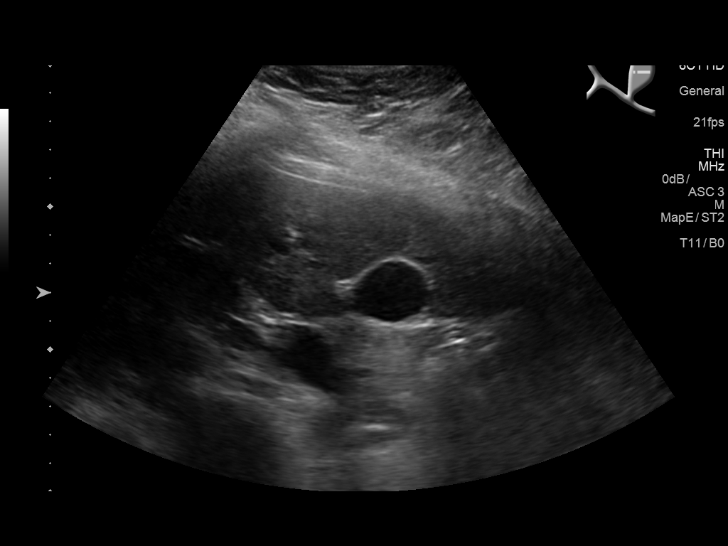
[im 13/78]
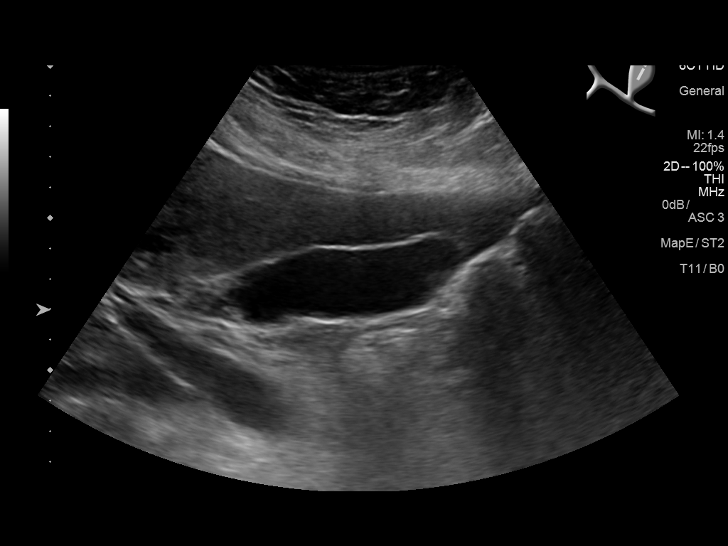
[im 20/78]
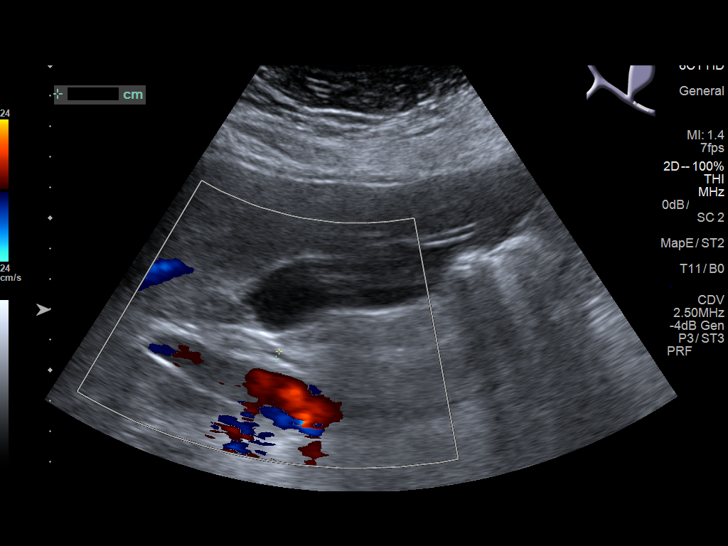
[im 26/78]
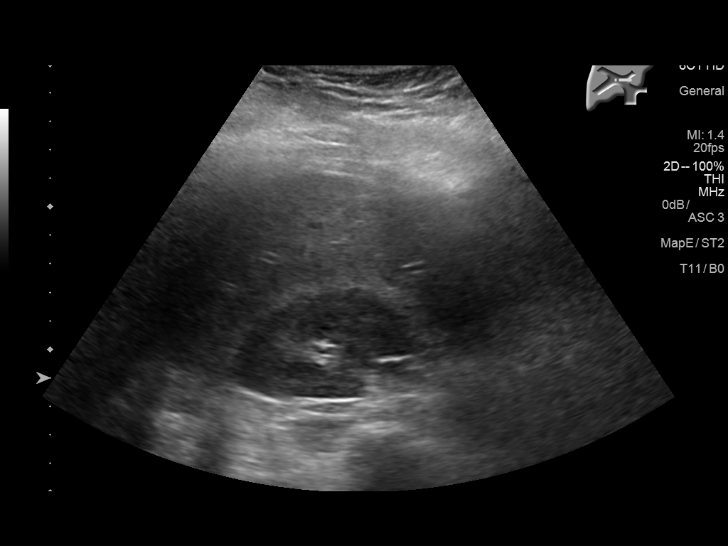
[im 29/78]
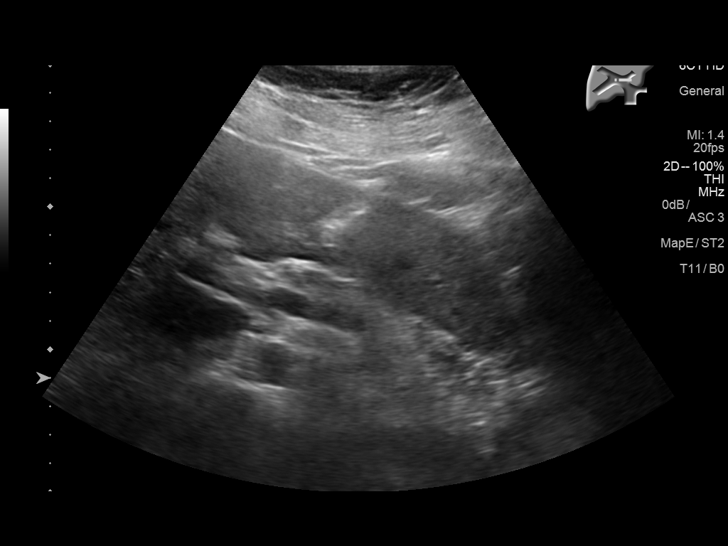
[im 36/78]
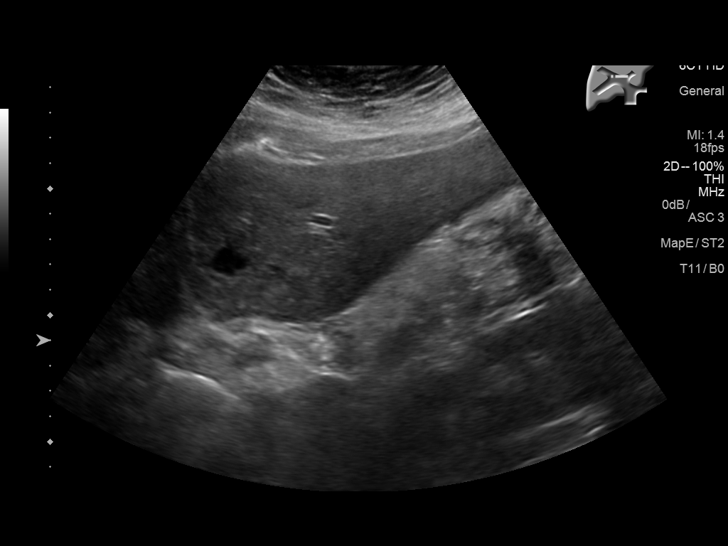
[im 42/78]
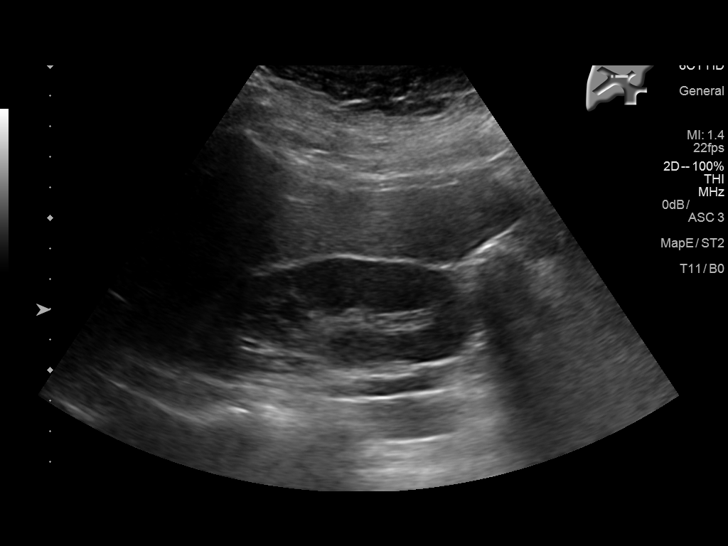
[im 49/78]
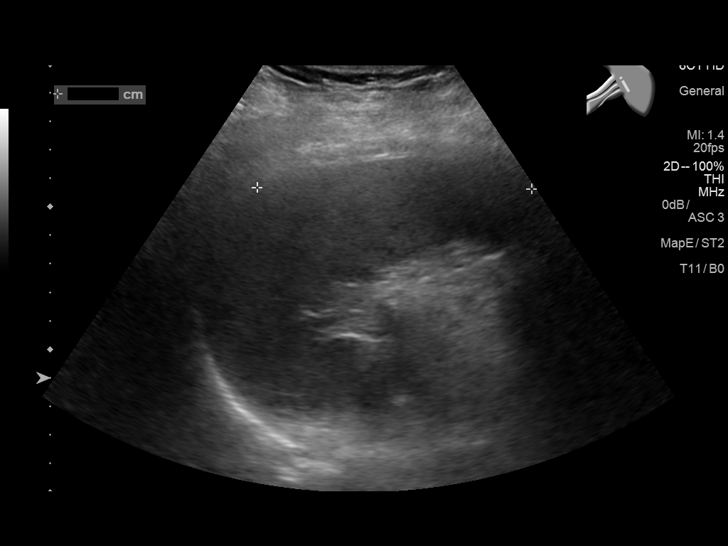
[im 52/78]
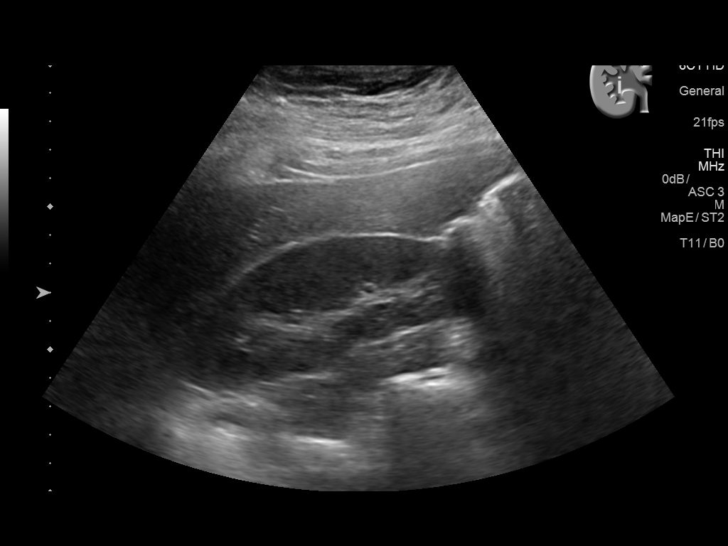
[im 58/78]
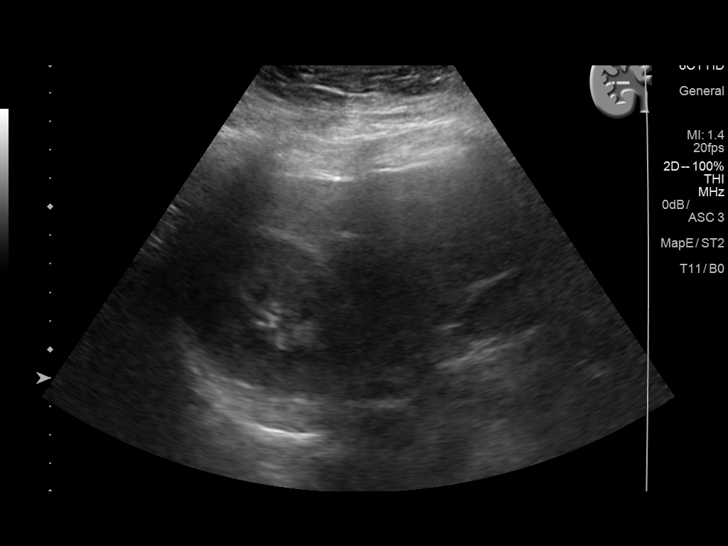
[im 65/78]
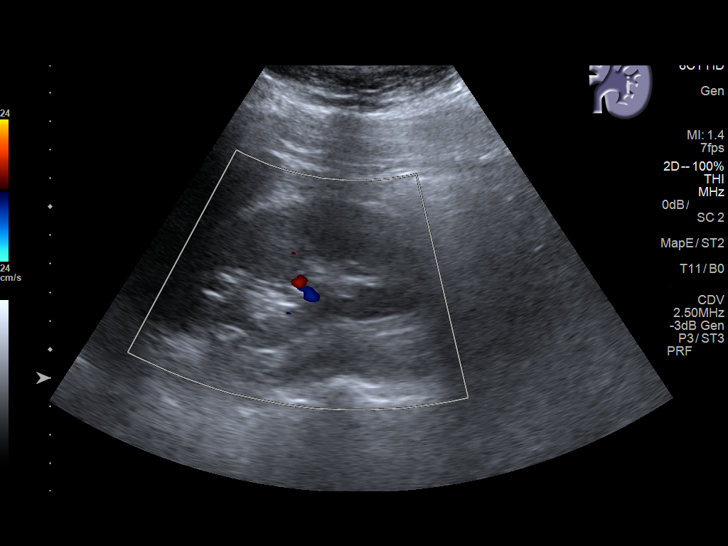
[im 71/78]
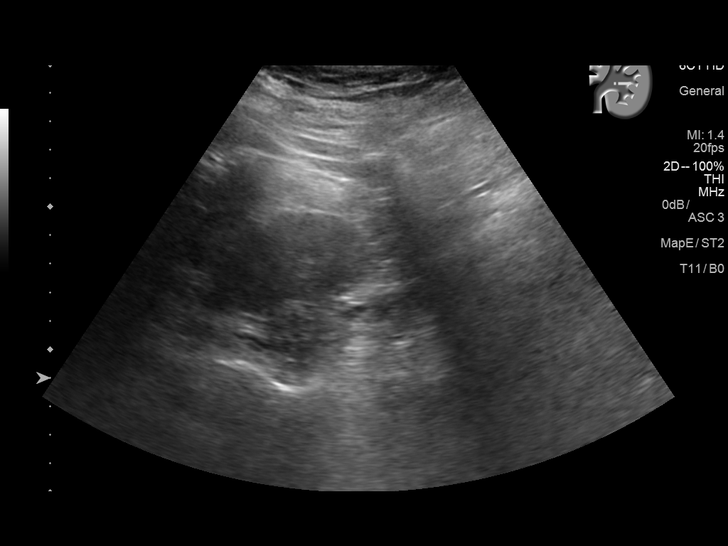
[im 78/78]
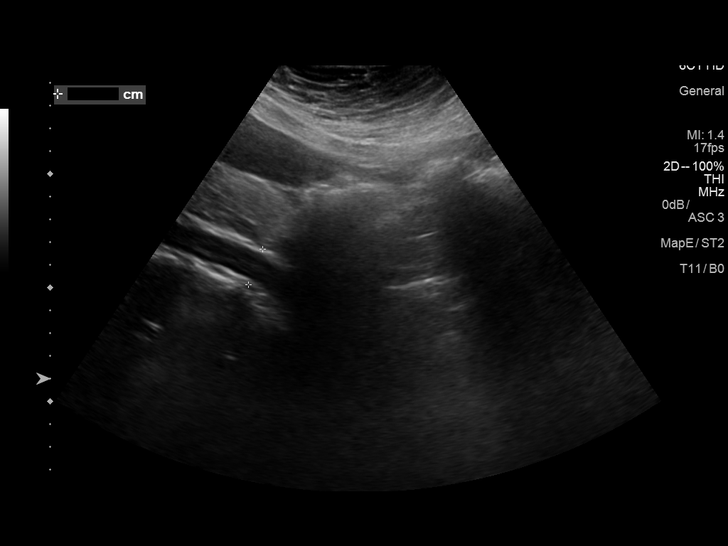

[14 of 25 positions shown; findings below may reference images not displayed]

FINDINGS: Gallbladder: No gallstones or wall thickening visualized. No
sonographic Murphy sign noted by sonographer. Maximal wall thickness
is 2.5 mm, within normal limits.

Common bile duct: Diameter: 2.5 mm, within normal limits

Liver: The liver is mildly hyperechoic. No discrete lesions are
evident.

IVC: No abnormality visualized.

Pancreas: Visualized portion unremarkable.

Spleen: Size and appearance within normal limits.

Right Kidney: Length: 11.8 cm, within normal limits. Echogenicity
within normal limits. No mass or hydronephrosis visualized.

Left Kidney: Length: 10.4 cm, within normal limits. Echogenicity
within normal limits. No mass or hydronephrosis visualized.

Abdominal aorta: No aneurysm visualized.

Other findings: None.
IMPRESSION: 1. Mild fatty infiltration of the liver.
2. No acute or focal lesions otherwise.

## 2019-01-15 ENCOUNTER — Other Ambulatory Visit: Payer: Self-pay

## 2019-01-15 ENCOUNTER — Encounter (HOSPITAL_COMMUNITY): Payer: Self-pay

## 2019-01-15 ENCOUNTER — Emergency Department (HOSPITAL_COMMUNITY): Payer: Self-pay

## 2019-01-15 ENCOUNTER — Emergency Department (HOSPITAL_COMMUNITY)
Admission: EM | Admit: 2019-01-15 | Discharge: 2019-01-15 | Disposition: A | Discharge status: Home / Self Care | Payer: Self-pay | Attending: Emergency Medicine | Admitting: Emergency Medicine

## 2019-01-15 DIAGNOSIS — B373 Candidiasis of vulva and vagina: Secondary | ICD-10-CM | POA: Insufficient documentation

## 2019-01-15 DIAGNOSIS — B3731 Acute candidiasis of vulva and vagina: Secondary | ICD-10-CM

## 2019-01-15 DIAGNOSIS — J45909 Unspecified asthma, uncomplicated: Secondary | ICD-10-CM | POA: Insufficient documentation

## 2019-01-15 DIAGNOSIS — F17200 Nicotine dependence, unspecified, uncomplicated: Secondary | ICD-10-CM | POA: Insufficient documentation

## 2019-01-15 DIAGNOSIS — R1010 Upper abdominal pain, unspecified: Secondary | ICD-10-CM

## 2019-01-15 LAB — COMPREHENSIVE METABOLIC PANEL
ALT: 12 U/L (ref 0–44)
AST: 12 U/L — ABNORMAL LOW (ref 15–41)
Albumin: 3.5 g/dL (ref 3.5–5.0)
Alkaline Phosphatase: 66 U/L (ref 38–126)
Anion gap: 10 (ref 5–15)
BUN: 9 mg/dL (ref 6–20)
CO2: 23 mmol/L (ref 22–32)
Calcium: 8.9 mg/dL (ref 8.9–10.3)
Chloride: 105 mmol/L (ref 98–111)
Creatinine, Ser: 0.76 mg/dL (ref 0.44–1.00)
GFR calc Af Amer: >60 mL/min (ref >60)
GFR calc non Af Amer: >60 mL/min (ref >60)
Glucose, Bld: 84 mg/dL (ref 70–99)
Potassium: 4 mmol/L (ref 3.5–5.1)
Sodium: 138 mmol/L (ref 135–145)
Total Bilirubin: 0.4 mg/dL (ref 0.3–1.2)
Total Protein: 7.1 g/dL (ref 6.5–8.1)

## 2019-01-15 LAB — CBC
HCT: 35.4 % — ABNORMAL LOW (ref 36.0–46.0)
Hemoglobin: 10.8 g/dL — ABNORMAL LOW (ref 12.0–15.0)
MCH: 26 pg (ref 26.0–34.0)
MCHC: 30.5 g/dL (ref 30.0–36.0)
MCV: 85.3 fL (ref 80.0–100.0)
Platelets: 156 10*3/uL (ref 150–400)
RBC: 4.15 MIL/uL (ref 3.87–5.11)
RDW: 16.9 % — ABNORMAL HIGH (ref 11.5–15.5)
WBC: 15.9 10*3/uL — ABNORMAL HIGH (ref 4.0–10.5)
nRBC: 0 % (ref 0.0–0.2)

## 2019-01-15 LAB — URINALYSIS, ROUTINE W REFLEX MICROSCOPIC
Bilirubin Urine: NEGATIVE
Glucose, UA: NEGATIVE mg/dL
Ketones, ur: NEGATIVE mg/dL
Nitrite: NEGATIVE
Protein, ur: NEGATIVE mg/dL
Specific Gravity, Urine: 1.018 (ref 1.005–1.030)
pH: 6 (ref 5.0–8.0)

## 2019-01-15 LAB — WET PREP, GENITAL
Clue Cells Wet Prep HPF POC: NONE SEEN
Sperm: NONE SEEN
Trich, Wet Prep: NONE SEEN

## 2019-01-15 LAB — I-STAT BETA HCG BLOOD, ED (MC, WL, AP ONLY): I-stat hCG, quantitative: <5 m[IU]/mL (ref <5)

## 2019-01-15 LAB — LIPASE, BLOOD: Lipase: 24 U/L (ref 11–51)

## 2019-01-15 MED ORDER — SODIUM CHLORIDE 0.9% FLUSH: 3.0000 mL | Freq: Once | INTRAVENOUS | Status: DC

## 2019-01-15 MED ORDER — FLUCONAZOLE 150 MG PO TABS: 150.0000 mg | ORAL_TABLET | Freq: Once | ORAL | 0 refills | Status: AC

## 2019-01-15 MED ORDER — FAMOTIDINE 20 MG PO TABS: 20.0000 mg | ORAL_TABLET | Freq: Two times a day (BID) | ORAL | 0 refills | Status: DC

## 2019-01-15 MED ORDER — MORPHINE SULFATE (PF) 4 MG/ML IV SOLN
4.0000 mg | Freq: Once | INTRAVENOUS | Status: AC
  Administered 2019-01-15: 4 mg via INTRAMUSCULAR
  Filled 2019-01-15: qty 1

## 2019-01-15 NOTE — Discharge Instructions (Signed)
Avoid fatty, spicy and acidic foods.  Follow-up with general surgery should your upper abdominal symptoms persist.  Return immediately for worsening pain, persistent vomiting, fever or for any concerns.

## 2019-01-15 NOTE — ED Provider Notes (Signed)
[facility]-EMERGENCY DEPT Provider Note   CSN: [id number] Arrival date & time: 01/15/19  1445    History   Chief Complaint Chief Complaint  Patient presents with   Abdominal Pain    HPI Belinda Brady is a 23 y.o. female.     HPI Patient states she came off of her period day before yesterday.  Endorses several days of lower abdominal pain associated with this.  States that she took Midol yesterday with little relief.  Ate 5 guys yesterday evening and developed upper abdominal pain after eating.  No radiation of the pain.  Denied nausea or vomiting.  States pain was present through the night which prompted her to come to the emergency department for evaluation.  Denies melanotic or grossly bloody stools.  Denies constipation or diarrhea.  No urinary symptoms. Past Medical History:  Diagnosis Date   Asthma    Depression    Morbid obesity [facility](HCC)     Patient Active Problem List   Diagnosis Date Noted   Cesarean delivery delivered 10/09/2013   Depression 10/08/2013   Asthma, chronic 10/08/2013   Morbid obesity (HCC) 06/09/2013    Past Surgical History:  Procedure Laterality Date   ADENOIDECTOMY     CESAREAN SECTION N/A 10/09/2013   Procedure: CESAREAN SECTION;  Surgeon: Esmeralda ArthurSandra A Rivard, MD;  Location: [facility];  Service: Obstetrics;  Laterality: N/A;   TONSILLECTOMY     TYMPANOSTOMY       OB History    Gravida  1   Para  1   Term  1   Preterm      AB      Living  1     SAB      TAB      Ectopic      Multiple      Live Births  1            Home Medications    Prior to Admission medications   Medication Sig Start Date End Date Taking? Authorizing Provider  Acetaminophen-Caff-Pyrilamine (MIDOL COMPLETE) 500-60-15 MG TABS Take 2 tablets by mouth daily as needed (cramping).   Yes [provider]  albuterol (PROVENTIL HFA;VENTOLIN HFA) 108 (90 Base) MCG/ACT inhaler Inhale 2 puffs into the lungs every 4  (four) hours as needed for wheezing or shortness of breath. 07/27/16  Yes Mabe, Onalee Huaavid, NP  simethicone (GAS-X) 80 MG chewable tablet Chew 160 mg by mouth every 6 (six) hours as needed for flatulence.   Yes [provider]  famotidine (PEPCID) 20 MG tablet Take 1 tablet (20 mg total) by mouth 2 (two) times daily. 01/15/19   Loren RacerYelverton, Jaquan Sadowsky, MD  predniSONE (DELTASONE) 50 MG tablet 1 tab po daily for 6 days. Start evening on 07/28/2016. Take with food. Patient not taking: Reported on 01/15/2019 07/27/16   Hayden RasmussenMabe, Cowan Pilar, NP  norgestimate-ethinyl estradiol (SPRINTEC 28) 0.25-35 MG-MCG tablet Take 1 tablet by mouth daily. Patient not taking: Reported on 10/10/2015 10/12/13 02/07/16  Nigel BridgemanLatham, Vicki, CNM    Family History Family History  Problem Relation Age of Onset   Cirrhosis Father     Social History Social History   Tobacco Use   Smoking status: Current Every Day Smoker    Types: Cigars   Smokeless tobacco: Never Used  Substance Use Topics   Alcohol use: Yes   Drug use: No     Allergies   Patient has no known allergies.   Review of Systems Review of Systems  Constitutional:  Negative for chills and fever.  Respiratory: Negative for shortness of breath.   Cardiovascular: Negative for chest pain.  Gastrointestinal: Positive for abdominal pain. Negative for constipation, diarrhea, nausea and vomiting.  Genitourinary: Negative for flank pain, frequency, hematuria, pelvic pain, vaginal bleeding and vaginal discharge.  Musculoskeletal: Negative for back pain and myalgias.  Skin: Negative for rash and wound.  Neurological: Negative for dizziness, weakness, light-headedness, numbness and headaches.  All other systems reviewed and are negative.    Physical Exam Updated Vital Signs BP (!) 145/86    Pulse 74    Temp 98.8 F (37.1 C) (Oral)    Resp 17    Ht 5\' 7"  (1.702 m)    Wt (!) 155.1 kg    LMP 01/13/2019    SpO2 100%    BMI 53.56 kg/m   Physical Exam Vitals signs and  nursing note reviewed.  Constitutional:      Appearance: She is well-developed.  HENT:     Head: Normocephalic and atraumatic.     Mouth/Throat:     Mouth: Mucous membranes are moist.  Eyes:     Pupils: Pupils are equal, round, and reactive to light.  Neck:     Musculoskeletal: Normal range of motion and neck supple.  Cardiovascular:     Rate and Rhythm: Normal rate and regular rhythm.     Heart sounds: No murmur. No friction rub. No gallop.   Pulmonary:     Effort: Pulmonary effort is normal. No respiratory distress.     Breath sounds: Normal breath sounds. No stridor. No wheezing, rhonchi or rales.  Chest:     Chest wall: No tenderness.  Abdominal:     General: Bowel sounds are normal. There is no distension.     Palpations: Abdomen is soft. There is no mass.     Tenderness: There is abdominal tenderness. There is no right CVA tenderness, left CVA tenderness, guarding or rebound.     Hernia: No hernia is present.     Comments: Mild right lower quadrant tenderness to palpation.  No rebound or guarding.  Patient appears more tender in the epigastrium.  Again no rebound or guarding.  Genitourinary:    Vagina: Vaginal discharge and tenderness present.     Comments: Purulent vaginal discharge.  No cervical motion tenderness.  Mild bilateral adnexal tenderness Musculoskeletal: Normal range of motion.        General: No swelling, tenderness, deformity or signs of injury.     Right lower leg: No edema.     Left lower leg: No edema.  Skin:    General: Skin is warm and dry.     Findings: No erythema or rash.  Neurological:     General: No focal deficit present.     Mental Status: She is alert and oriented to person, place, and time.  Psychiatric:        Mood and Affect: Mood normal.        Behavior: Behavior normal.      ED Treatments / Results  Labs (all labs ordered are listed, but only abnormal results are displayed) Labs Reviewed  WET PREP, GENITAL - Abnormal; Notable for  the following components:      Result Value   Yeast Wet Prep HPF POC PRESENT (*)    WBC, Wet Prep HPF POC MODERATE (*)    All other components within normal limits  COMPREHENSIVE METABOLIC PANEL - Abnormal; Notable for the following components:   AST 12 (*)  All other components within normal limits  CBC - Abnormal; Notable for the following components:   WBC 15.9 (*)    Hemoglobin 10.8 (*)    HCT 35.4 (*)    RDW 16.9 (*)    All other components within normal limits  URINALYSIS, ROUTINE W REFLEX MICROSCOPIC - Abnormal; Notable for the following components:   APPearance HAZY (*)    Hgb urine dipstick SMALL (*)    Leukocytes,Ua LARGE (*)    Bacteria, UA RARE (*)    All other components within normal limits  LIPASE, BLOOD  I-STAT BETA HCG BLOOD, ED ([facility], [facility], [facility] ONLY)  GC/CHLAMYDIA PROBE AMP ([place]) NOT AT [facility]    EKG None  Radiology US Abdomen Limited  Result Date: 01/15/2019 CLINICAL DATA:  Right upper quadrant abdominal pain. EXAM: ULTRASOUND ABDOMEN LIMITED RIGHT UPPER QUADRANT COMPARISON:  None. FINDINGS: Gallbladder: There is some sludge in the gallbladder. No stones, wall thickening, pericholecystic fluid, or Murphy's sign. The gallbladder is somewhat distended. There is some mild overlying tenderness but no reported Murphy's sign. Common bile duct: Diameter: 3.3 mm Liver: No focal lesion identified. Within normal limits in parenchymal echogenicity. Portal vein is patent on color Doppler imaging with normal direction of blood flow towards the liver. IMPRESSION: 1. There is a small amount of gallbladder sludge and mild right upper quadrant tenderness with scanning, but no focal Murphy's sign. No stones, wall thickening, or pericholecystic fluid. Electronically Signed   By: Dorise Bullion III M.D   On: 01/15/2019 17:05    Procedures Procedures (including critical care time)  Medications Ordered in ED Medications  morphine 4 MG/ML injection 4 mg (4 mg Intramuscular  Given 01/15/19 1609)     Initial Impression / Assessment and Plan / ED Course  I have reviewed the triage vital signs and the nursing notes.  Pertinent labs & imaging results that were available during my care of the patient were reviewed by me and considered in my medical decision making (see chart for details).        Patient states she is feeling much better.  Upper abdominal pain has resolved.  Question gastritis versus biliary colic.  Patient does have some sludge on her ultrasound.  No evidence of cholecystitis.  Will start on Pepcid and have advised dietary changes.  Patient does appear to have vaginal candidiasis.  No evidence of PID.  Low suspicion for tubo-ovarian abscess or ovarian torsion.  Discharged home with prescription for Diflucan.  Return precautions given.  Final Clinical Impressions(s) / ED Diagnoses   Final diagnoses:  Pain of upper abdomen  Vaginal candidiasis    ED Discharge Orders         Ordered    fluconazole (DIFLUCAN) 150 MG tablet   Once     01/15/19 1953    famotidine (PEPCID) 20 MG tablet  2 times daily     01/15/19 1953           Julianne Rice, MD 01/16/19 1600

## 2019-01-15 NOTE — ED Notes (Signed)
Pelvic set up at the bedside. Nurse aware.

## 2019-01-15 NOTE — ED Triage Notes (Signed)
Patient c/o upper and lower abdominal pain. Patient denies any N/v/d. Patient states pain is worse after eating or lying flat in the bed.

## 2019-01-17 LAB — GC/CHLAMYDIA PROBE AMP (~~LOC~~) NOT AT ARMC
Chlamydia: POSITIVE — AB
Neisseria Gonorrhea: POSITIVE — AB

## 2019-06-28 ENCOUNTER — Encounter (HOSPITAL_COMMUNITY): Payer: Self-pay | Admitting: Emergency Medicine

## 2019-06-28 ENCOUNTER — Other Ambulatory Visit: Payer: Self-pay

## 2019-06-28 ENCOUNTER — Emergency Department (HOSPITAL_COMMUNITY)
Admission: EM | Admit: 2019-06-28 | Discharge: 2019-06-28 | Disposition: A | Discharge status: Home / Self Care | Payer: Medicaid Other | Attending: Emergency Medicine | Admitting: Emergency Medicine

## 2019-06-28 DIAGNOSIS — R05 Cough: Secondary | ICD-10-CM | POA: Insufficient documentation

## 2019-06-28 DIAGNOSIS — J45909 Unspecified asthma, uncomplicated: Secondary | ICD-10-CM | POA: Insufficient documentation

## 2019-06-28 DIAGNOSIS — M542 Cervicalgia: Secondary | ICD-10-CM | POA: Insufficient documentation

## 2019-06-28 DIAGNOSIS — Z79899 Other long term (current) drug therapy: Secondary | ICD-10-CM | POA: Insufficient documentation

## 2019-06-28 DIAGNOSIS — R053 Chronic cough: Secondary | ICD-10-CM

## 2019-06-28 DIAGNOSIS — F17210 Nicotine dependence, cigarettes, uncomplicated: Secondary | ICD-10-CM | POA: Insufficient documentation

## 2019-06-28 MED ORDER — METHOCARBAMOL 500 MG PO TABS: 1000.0000 mg | ORAL_TABLET | Freq: Four times a day (QID) | ORAL | 0 refills | Status: DC

## 2019-06-28 MED ORDER — OMEPRAZOLE 20 MG PO CPDR: 20.0000 mg | DELAYED_RELEASE_CAPSULE | Freq: Every day | ORAL | 1 refills | Status: DC

## 2019-06-28 NOTE — ED Triage Notes (Signed)
Patient presents with concerns about her neck pain which has lasted over a week. She is unsure of the cause. She notices the pain bilateral when she turns her head and sometimes feels pain posteriorly. The patient also complains of a productive cough she has had for 3 months now. She does smoke.

## 2019-06-28 NOTE — ED Provider Notes (Signed)
[facility] DEPT Provider Note   CSN: [id number] Arrival date & time: 06/28/19  1340     History   Chief Complaint Chief Complaint  Patient presents with  . Neck Pain  . Cough    HPI Belinda Brady is a 23 y.o. female.     Patient with past history of asthma and obesity presents to the emergency department with complaint of neck pain and chronic cough.  Neck pain has been ongoing for 1 week.  She describes pain in the muscles of the bilateral neck.  Pain is worse with movement.  It is an aching pain.  It does not radiate into her arms.  She denies any weakness in her arms or legs.  No difficulty with walking.  She denies any pain at onset.  He has been taking BC powder, Aleve without improvement.  In addition she has a cough that is been ongoing for 3 months.  No fevers or chest pain.  She has a history of asthma but denies any current wheezing.  She does have frequent nasal congestion that is mild.  No allergy history.  She has heartburn symptoms about 2 times per week for which she takes over-the-counter medication just as needed.  Cough is productive of mucus.  No shortness of breath or trouble breathing. Patient denies risk factors for pulmonary embolism including: unilateral leg swelling, history of DVT/PE/other blood clots, use of exogenous hormones, recent immobilizations, recent surgery, recent travel (>4hr segment), malignancy, hemoptysis.       Past Medical History:  Diagnosis Date  . Asthma   . Depression   . Morbid obesity [facility])     Patient Active Problem List   Diagnosis Date Noted  . Cesarean delivery delivered 10/09/2013  . Depression 10/08/2013  . Asthma, chronic 10/08/2013  . Morbid obesity ([place]) 06/09/2013    Past Surgical History:  Procedure Laterality Date  . ADENOIDECTOMY    . CESAREAN SECTION N/A 10/09/2013   Procedure: CESAREAN SECTION;  Surgeon: Alwyn Pea, MD;  Location: [facility];  Service: Obstetrics;   Laterality: N/A;  . TONSILLECTOMY    . TYMPANOSTOMY       OB History    Gravida  1   Para  1   Term  1   Preterm      AB      Living  1     SAB      TAB      Ectopic      Multiple      Live Births  1            Home Medications    Prior to Admission medications   Medication Sig Start Date End Date Taking? Authorizing Provider  Acetaminophen-Caff-Pyrilamine (MIDOL COMPLETE) 500-60-15 MG TABS Take 2 tablets by mouth daily as needed (cramping).    [provider]  albuterol (PROVENTIL HFA;VENTOLIN HFA) 108 (90 Base) MCG/ACT inhaler Inhale 2 puffs into the lungs every 4 (four) hours as needed for wheezing or shortness of breath. 07/27/16   Janne Napoleon, NP  famotidine (PEPCID) 20 MG tablet Take 1 tablet (20 mg total) by mouth 2 (two) times daily. 01/15/19   Julianne Rice, MD  predniSONE (DELTASONE) 50 MG tablet 1 tab po daily for 6 days. Start evening on 07/28/2016. Take with food. Patient not taking: Reported on 01/15/2019 07/27/16   Janne Napoleon, NP  simethicone (GAS-X) 80 MG chewable tablet Chew 160 mg by mouth every 6 (six) hours  as needed for flatulence.    [provider]  norgestimate-ethinyl estradiol (SPRINTEC 28) 0.25-35 MG-MCG tablet Take 1 tablet by mouth daily. Patient not taking: Reported on 10/10/2015 10/12/13 02/07/16  Nigel Bridgeman, CNM    Family History Family History  Problem Relation Age of Onset  . Cirrhosis Father     Social History Social History   Tobacco Use  . Smoking status: Current Every Day Smoker    Types: Cigars  . Smokeless tobacco: Never Used  Substance Use Topics  . Alcohol use: Yes  . Drug use: No     Allergies   Patient has no known allergies.   Review of Systems Review of Systems  Constitutional: Negative for fever.  HENT: Negative for rhinorrhea and sore throat.   Eyes: Negative for redness.  Respiratory: Positive for cough. Negative for shortness of breath.   Cardiovascular: Negative for  chest pain.  Gastrointestinal: Negative for abdominal pain, diarrhea, nausea and vomiting.  Genitourinary: Negative for dysuria.  Musculoskeletal: Positive for myalgias and neck pain.  Skin: Negative for rash.  Neurological: Negative for headaches.     Physical Exam Updated Vital Signs BP 124/82   Pulse 99   Temp 98.9 F (37.2 C) (Oral)   Resp 16   Ht 5\' 7"  (1.702 m)   Wt (!) 148.8 kg   SpO2 100%   BMI 51.37 kg/m   Physical Exam Vitals signs and nursing note reviewed.  Constitutional:      Appearance: She is well-developed.  HENT:     Head: Normocephalic and atraumatic.     Right Ear: Tympanic membrane, ear canal and external ear normal.     Left Ear: Tympanic membrane, ear canal and external ear normal.     Nose: No congestion or rhinorrhea.     Mouth/Throat:     Mouth: Mucous membranes are moist.     Pharynx: No posterior oropharyngeal erythema.  Eyes:     General:        Right eye: No discharge.        Left eye: No discharge.     Conjunctiva/sclera: Conjunctivae normal.  Neck:     Musculoskeletal: Normal range of motion and neck supple.  Cardiovascular:     Rate and Rhythm: Normal rate and regular rhythm.     Heart sounds: Normal heart sounds.  Pulmonary:     Effort: Pulmonary effort is normal.     Breath sounds: Normal breath sounds.     Comments: Lungs are clear on exam without wheezing, rhonchi, rales. Abdominal:     Palpations: Abdomen is soft.     Tenderness: There is no abdominal tenderness.  Musculoskeletal:     Cervical back: She exhibits tenderness. She exhibits normal range of motion and no bony tenderness.     Thoracic back: She exhibits normal range of motion, no tenderness and no bony tenderness.     Lumbar back: She exhibits normal range of motion, no tenderness and no bony tenderness.       Back:  Skin:    General: Skin is warm and dry.  Neurological:     Mental Status: She is alert.      ED Treatments / Results  Labs (all labs  ordered are listed, but only abnormal results are displayed) Labs Reviewed - No data to display  EKG None  Radiology No results found.  Procedures Procedures (including critical care time)  Medications Ordered in ED Medications - No data to display   Initial Impression /  Assessment and Plan / ED Course  I have reviewed the triage vital signs and the nursing notes.  Pertinent labs & imaging results that were available during my care of the patient were reviewed by me and considered in my medical decision making (see chart for details).        Patient seen and examined.   Vital signs reviewed and are as follows: BP 124/82   Pulse 99   Temp 98.9 F (37.2 C) (Oral)   Resp 16   Ht 5\' 7"  (1.702 m)   Wt (!) 148.8 kg   SpO2 100%   BMI 51.37 kg/m   For neck pain: We will prescribe a course of Robaxin.  Discussed use of heat on the neck as well. Patient counseled on proper use of muscle relaxant medication.  They were told not to drink alcohol, drive any vehicle, or do any dangerous activities while taking this medication.  Patient verbalized understanding.  For chronic cough: Patient does have history of asthma but this seems reasonably controlled.  She noted some ongoing nasal congestion so she could possibly have postnasal drip.  She does have significant heartburn symptoms chronically twice a week.  Concern for GERD related cough.  Will empirically treat with omeprazole.   Final Clinical Impressions(s) / ED Diagnoses   Final diagnoses:  Neck pain  Chronic cough   As above.   ED Discharge Orders         Ordered    omeprazole (PRILOSEC) 20 MG capsule  Daily     06/28/19 1530    methocarbamol (ROBAXIN) 500 MG tablet  4 times daily     06/28/19 1530           Renne CriglerGeiple, Kani Jobson, PA-C 06/28/19 1536    Loren RacerYelverton, David, MD 06/28/19 2138

## 2019-06-28 NOTE — Discharge Instructions (Signed)
Please read and follow all provided instructions.  Your diagnoses today include:  1. Neck pain   2. Chronic cough     Tests performed today include:  Vital signs - see below for your results today  Medications prescribed:   Robaxin (methocarbamol) - muscle relaxer medication  DO NOT drive or perform any activities that require you to be awake and alert because this medicine can make you drowsy.    Omeprazole (Prilosec) - stomach acid reducer  This medication can be found over-the-counter  Take any prescribed medications only as directed.  Home care instructions:   Follow any educational materials contained in this packet  Please rest, use ice or heat on your back for the next several days  Do not lift, push, pull anything more than 10 pounds for the next week  Follow-up instructions: Please follow-up with your primary care provider in the next 2 weeks for further evaluation of your symptoms, especially if not feeling better.   Return instructions:  SEEK IMMEDIATE MEDICAL ATTENTION IF YOU HAVE:  New numbness, tingling, weakness, or problem with the use of your arms or legs  Severe back pain not relieved with medications  Loss control of your bowels or bladder  Increasing pain in any areas of the body (such as chest or abdominal pain)  Shortness of breath, dizziness, or fainting.   Worsening nausea (feeling sick to your stomach), vomiting, fever, or sweats  Any other emergent concerns regarding your health   Additional Information:  Your vital signs today were: BP 124/82    Pulse 99    Temp 98.9 F (37.2 C) (Oral)    Resp 16    Ht 5\' 7"  (1.702 m)    Wt (!) 148.8 kg    SpO2 100%    BMI 51.37 kg/m  If your blood pressure (BP) was elevated above 135/85 this visit, please have this repeated by your doctor within one month. --------------

## 2020-07-02 ENCOUNTER — Ambulatory Visit: Payer: Medicaid Other | Admitting: Physician Assistant

## 2020-08-12 ENCOUNTER — Other Ambulatory Visit: Payer: Medicaid Other

## 2020-08-12 DIAGNOSIS — Z20822 Contact with and (suspected) exposure to covid-19: Secondary | ICD-10-CM

## 2020-08-14 LAB — SARS-COV-2, NAA 2 DAY TAT

## 2020-08-14 LAB — NOVEL CORONAVIRUS, NAA: SARS-CoV-2, NAA: NOT DETECTED

## 2020-08-19 IMAGING — US ULTRASOUND ABDOMEN LIMITED
1 series · 14 of 25 positions shown · non-contrast
Comparison: None.

CLINICAL DATA: Right upper quadrant abdominal pain.

EXAM:
ULTRASOUND ABDOMEN LIMITED RIGHT UPPER QUADRANT

[Series 1: ultrasound abdomen limited · 14 of 128 slices shown]
[im 1/128]
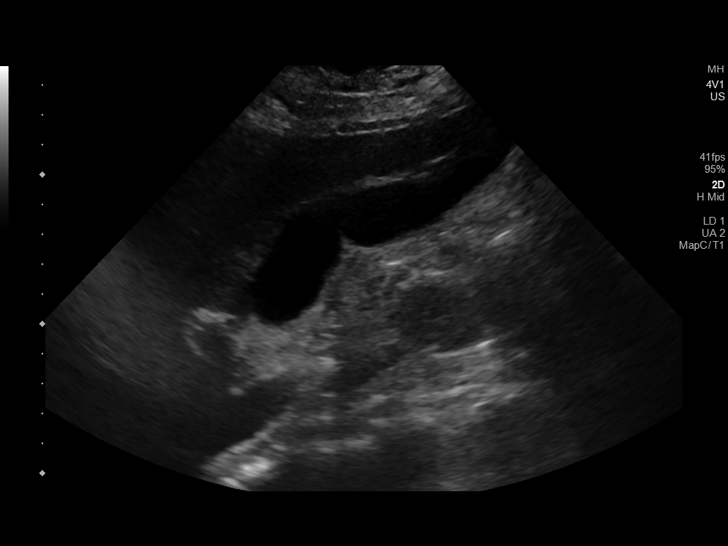
[im 11/128]
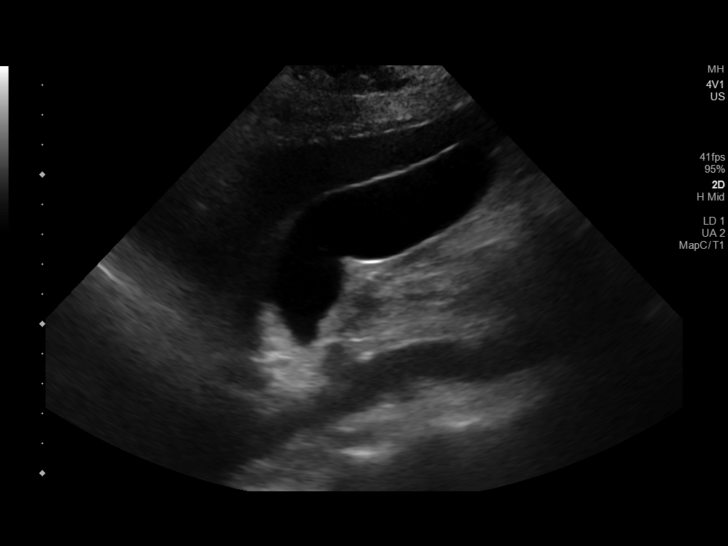
[im 22/128]
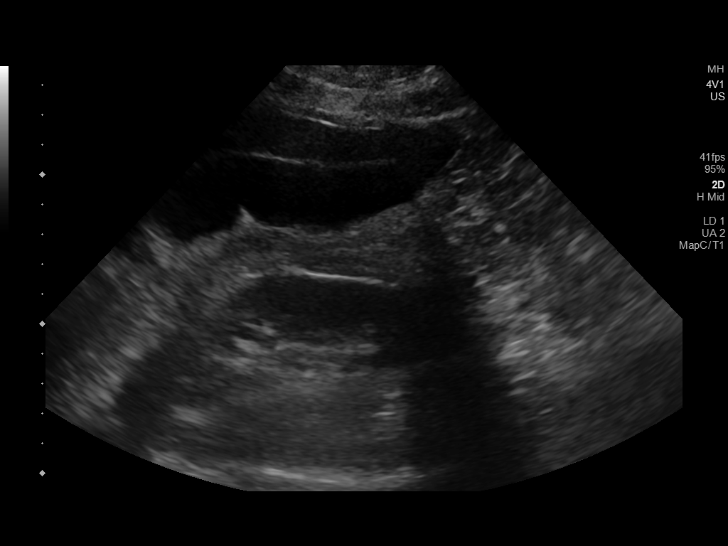
[im 32/128]
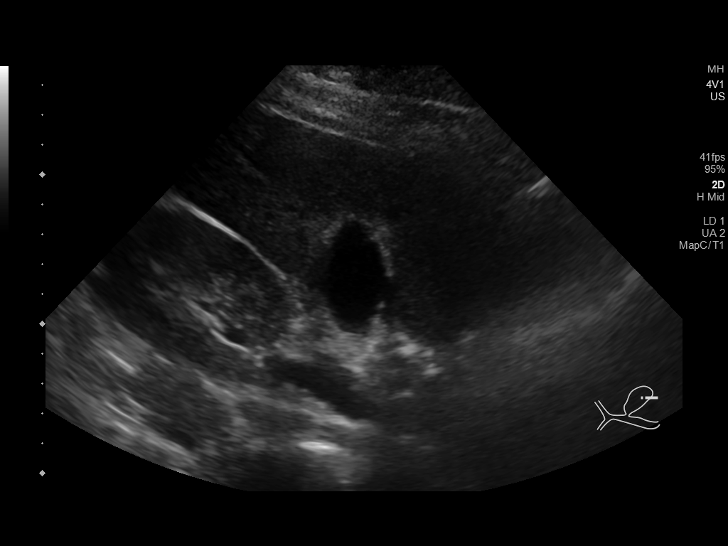
[im 43/128]
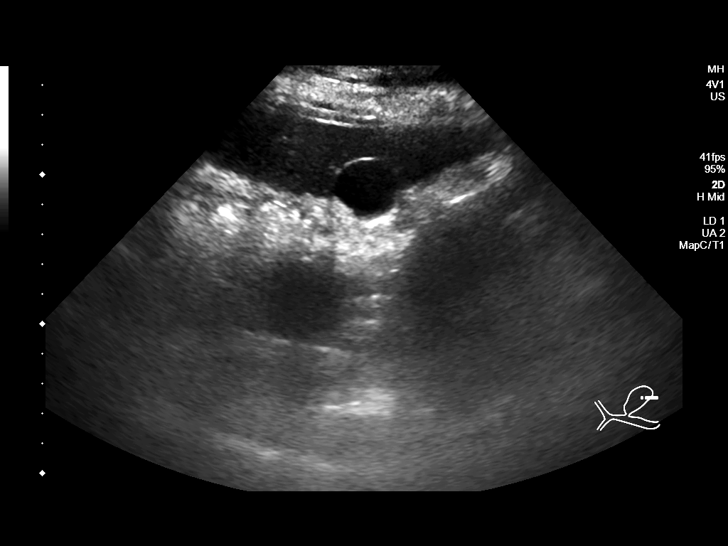
[im 48/128]
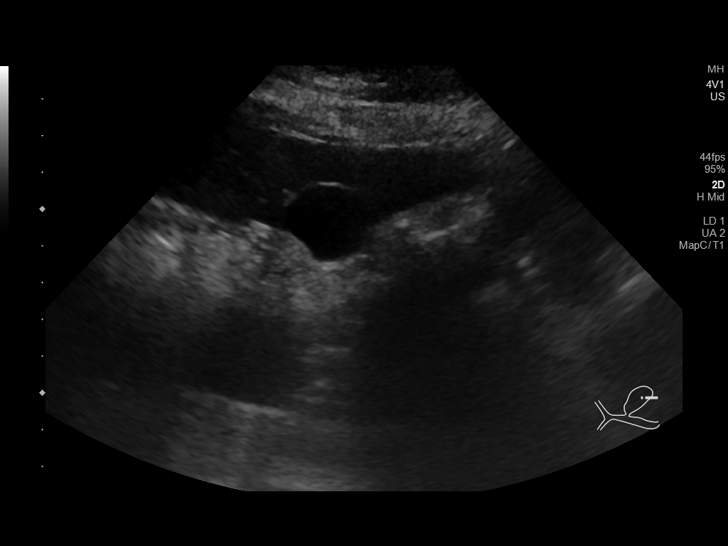
[im 59/128]
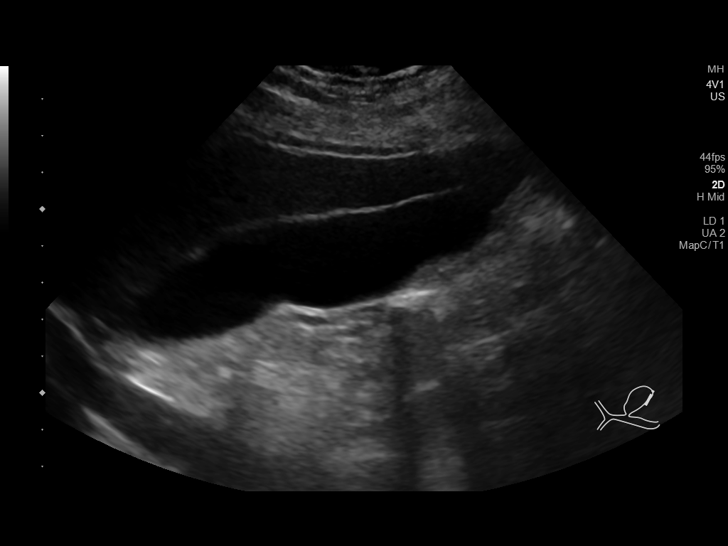
[im 69/128]
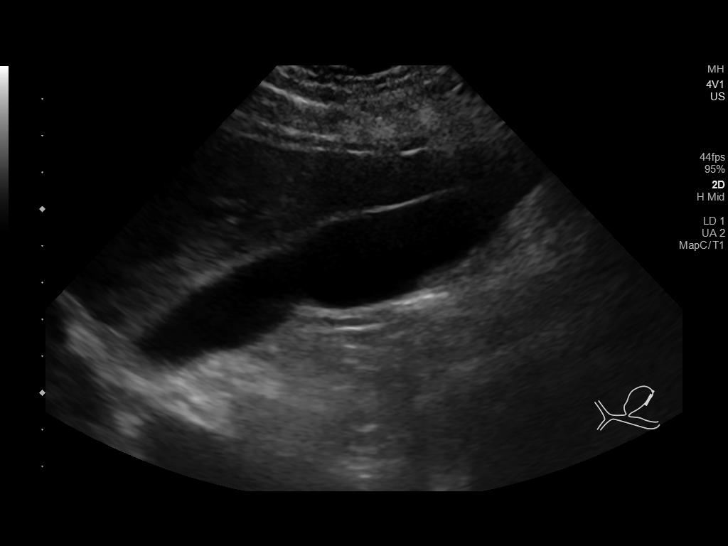
[im 80/128]
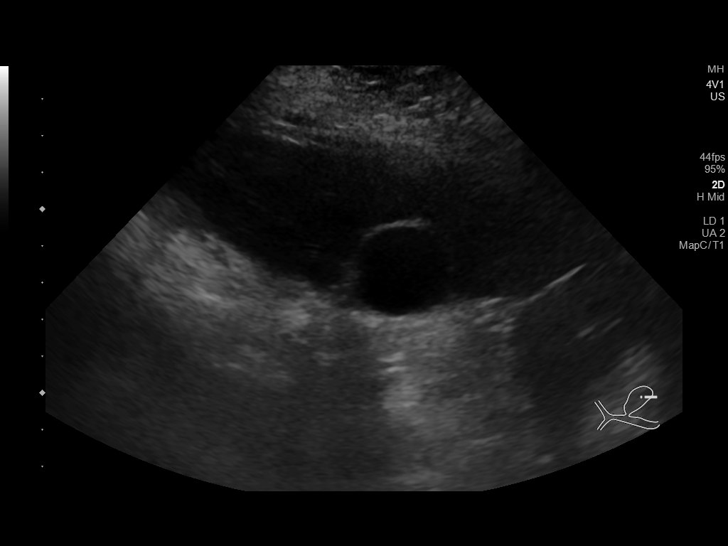
[im 85/128]
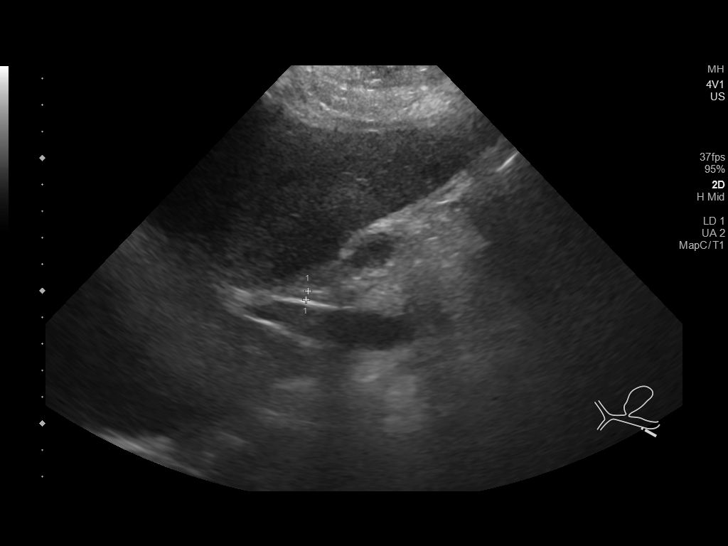
[im 96/128]
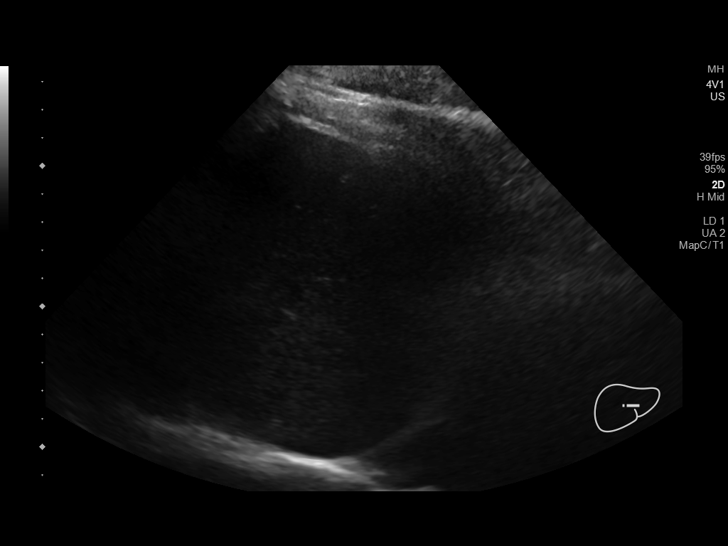
[im 106/128]
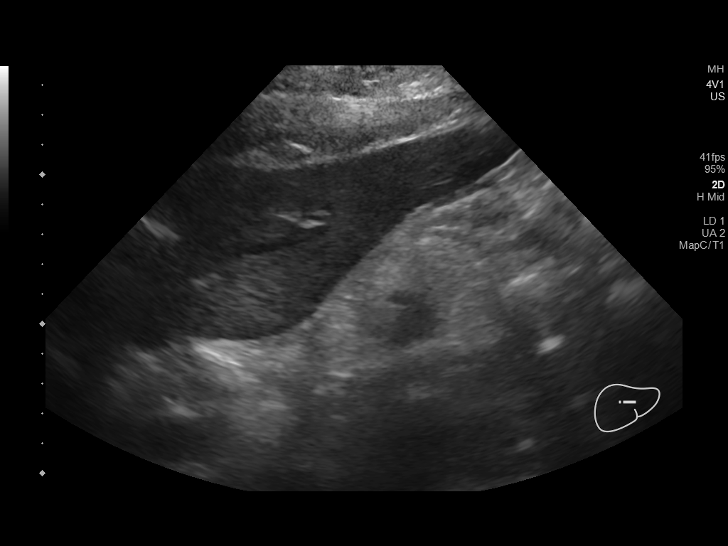
[im 117/128]
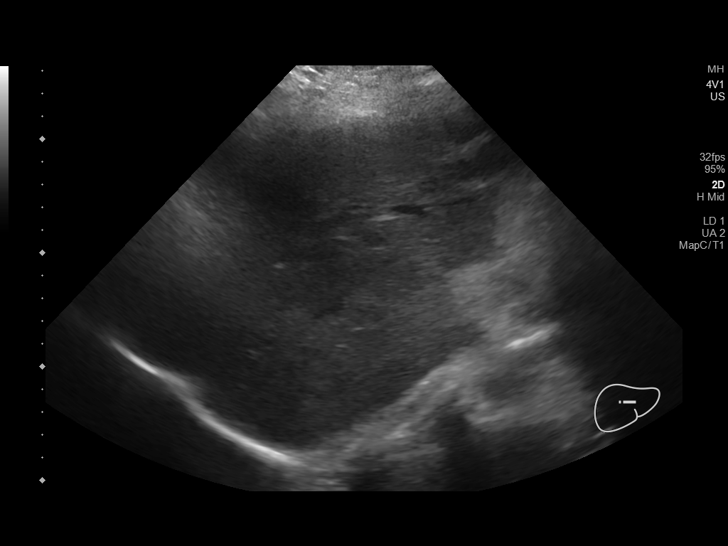
[im 128/128]
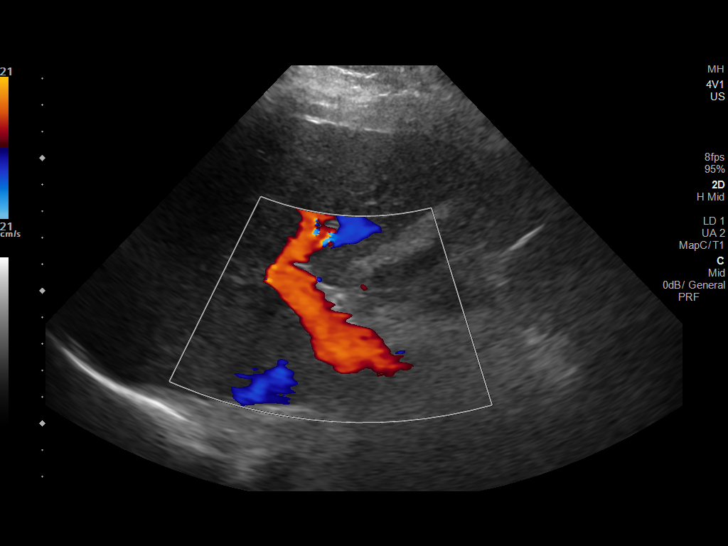

[14 of 25 positions shown; findings below may reference images not displayed]

FINDINGS: Gallbladder:

There is some sludge in the gallbladder. No stones, wall thickening,
pericholecystic fluid, or Murphy's sign. The gallbladder is somewhat
distended. There is some mild overlying tenderness but no reported
Murphy's sign.

Common bile duct:

Diameter: 3.3 mm

Liver:

No focal lesion identified. Within normal limits in parenchymal
echogenicity. Portal vein is patent on color Doppler imaging with
normal direction of blood flow towards the liver.
IMPRESSION: 1. There is a small amount of gallbladder sludge and mild right
upper quadrant tenderness with scanning, but no focal Murphy's sign.
No stones, wall thickening, or pericholecystic fluid.

## 2021-10-20 ENCOUNTER — Ambulatory Visit (HOSPITAL_BASED_OUTPATIENT_CLINIC_OR_DEPARTMENT_OTHER): Payer: Medicaid Other | Admitting: Nurse Practitioner

## 2021-10-20 NOTE — Patient Instructions (Incomplete)
Thank you for choosing [facility] for your Primary Care needs. I am excited for the opportunity to partner with you to meet your health care goals. It was a pleasure meeting you today! ? ?Recommendations from today's visit: ?*** ? ?Information on diet, exercise, and health maintenance recommendations are listed below. This is information to help you be sure you are on track for optimal health and monitoring.  ? ?Please look over this and let us know if you have any questions or if you have completed any of the health maintenance outside of [facility] so that we can be sure your records are up to date.  ?___________________________________________________________ ?About Me: ?I am an Adult-Geriatric Nurse Practitioner with a background in caring for patients for more than 20 years with a strong intensive care background. I provide primary care and sports medicine services to patients age 13 and older within this office. My education had a strong focus on caring for the older adult population, which I am passionate about. I am also the director of the APP Fellowship with [employer] [facility].  ? ?My desire is to provide you with the best service through preventive medicine and supportive care. I consider you a part of the medical team and value your input. I work diligently to ensure that you are heard and your needs are met in a safe and effective manner. I want you to feel comfortable with me as your provider and want you to know that your health concerns are important to me. ? ?For your information, our office hours are: ?Monday, Tuesday, and Thursday 8:00 AM - 5:00 PM ?Wednesday and Friday 8:00 AM - 12:00 PM.  ? ?In my time away from the office I am teaching new APP's within the system and am unavailable, but my partner, Dr. deCuba is in the office for emergent needs.  ? ?If you have questions or concerns, please call our office at [phone] or send us a MyChart message and we will  respond as quickly as possible.  ?____________________________________________________________ ?MyChart:  ?For all urgent or time sensitive needs we ask that you please call the office to avoid delays. Our number is [phone]. ?MyChart is not constantly monitored and due to the large volume of messages a day, replies may take up to 72 business hours. ? ?MyChart Policy: ?MyChart allows for you to see your visit notes, after visit summary, provider recommendations, lab and tests results, make an appointment, request refills, and contact your provider or the office for non-urgent questions or concerns. Providers are seeing patients during normal business hours and do not have built in time to review MyChart messages.  ?We ask that you allow a minimum of 3 business days for responses to MyChart messages. For this reason, please do not send urgent requests through MyChart. Please call the office at [phone]. ?New and ongoing conditions may require a visit. We have virtual and in person visit available for your convenience.  ?Complex MyChart concerns may require a visit. Your provider may request you schedule a virtual or in person visit to ensure we are providing the best care possible. ?MyChart messages sent after 11:00 AM on Friday will not be received by the provider until Monday morning.  ?  ?Lab and Test Results: ?You will receive your lab and test results on MyChart as soon as they are completed and results have been sent by the lab or testing facility. Due to this service, you will receive your results   BEFORE your provider.  ?I review lab and tests results each morning prior to seeing patients. Some results require collaboration with other providers to ensure you are receiving the most appropriate care. For this reason, we ask that you please allow a minimum of 3-5 business days from the time the ALL results have been received for your provider to receive and review lab and test results and contact you  about these.  ?Most lab and test result comments from the provider will be sent through MyChart. Your provider may recommend changes to the plan of care, follow-up visits, repeat testing, ask questions, or request an office visit to discuss these results. You may reply directly to this message or call the office at [phone] to provide information for the provider or set up an appointment. ?In some instances, you will be called with test results and recommendations. Please let us know if this is preferred and we will make note of this in your chart to provide this for you.    ?If you have not heard a response to your lab or test results in 5 business days from all results returning to MyChart, please call the office to let us know. We ask that you please avoid calling prior to this time unless there is an emergent concern. Due to high call volumes, this can delay the resulting process. ? ?After Hours: ?For all non-emergency after hours needs, please call the office at [phone] and select the option to reach the on-call provider service. On-call services are shared between multiple  offices and therefore it will not be possible to speak directly with your provider. On-call providers may provide medical advice and recommendations, but are unable to provide refills for maintenance medications.  ?For all emergency or urgent medical needs after normal business hours, we recommend that you seek care at the closest Urgent Care or Emergency Department to ensure appropriate treatment in a timely manner.  ?[facility] has a 24 hour emergency room located on the ground floor for your convenience.  ? ?Urgent Concerns During the Business Day ?Providers are seeing patients from 8AM to 5PM with a busy schedule and are most often not able to respond to non-urgent calls until the end of the day or the next business day. ?If you should have URGENT concerns during the day, please call and speak  to the nurse or schedule a same day appointment so that we can address your concern without delay.  ? ?Thank you, again, for choosing me as your health care partner. I appreciate your trust and look forward to learning more about you.  ? ?SaraBeth Elayne Gruver, DNP, AGNP-c ?___________________________________________________________ ? ?Health Maintenance Recommendations ?Screening Testing ?Mammogram ?Every 1 -2 years based on history and risk factors ?Starting at age 40 ?Pap Smear ?Ages 21-39 every 3 years ?Ages 30-65 every 5 years with HPV testing ?More frequent testing may be required based on results and history ?Colon Cancer Screening ?Every 1-10 years based on test performed, risk factors, and history ?Starting at age 45 ?Bone Density Screening ?Every 2-10 years based on history ?Starting at age 65 for women ?Recommendations for men differ based on medication usage, history, and risk factors ?AAA Screening ?One time ultrasound ?Men 65-75 years old who have every smoked ?Lung Cancer Screening ?Low Dose Lung CT every 12 months ?Age 55-80 years with a 30 pack-year smoking history who still smoke or who have quit within the last 15 years ? ?Screening Labs ?Routine  Labs: Complete   Blood Count (CBC), Complete Metabolic Panel (CMP), Cholesterol (Lipid Panel) ?Every 6-12 months based on history and medications ?May be recommended more frequently based on current conditions or previous results ?Hemoglobin A1c Lab ?Every 3-12 months based on history and previous results ?Starting at age 45 or earlier with diagnosis of diabetes, high cholesterol, BMI >26, and/or risk factors ?Frequent monitoring for patients with diabetes to ensure blood sugar control ?Thyroid Panel (TSH w/ T3 & T4) ?Every 6 months based on history, symptoms, and risk factors ?May be repeated more often if on medication ?HIV ?One time testing for all patients 13 and older ?May be repeated more frequently for patients with increased risk factors or  exposure ?Hepatitis C ?One time testing for all patients 18 and older ?May be repeated more frequently for patients with increased risk factors or exposure ?Gonorrhea, Chlamydia ?Every 12 months for all sexually activ

## 2021-10-20 NOTE — Progress Notes (Deleted)
?Tollie Eth, DNP, AGNP-c ?Primary Care & Sports Medicine ?[address]  [address] ?[place], [place] [address] ?[phone] ? ?New patient visit ? ? ?Patient: Belinda Brady   DOB: 1996-06-14   25 y.o. Female  MRN: [id number] ?Visit Date: 10/20/2021 ? ?Patient Care Team: ?Patient, No Pcp Per (Inactive) as PCP - General (General Practice) ? ?Today's healthcare provider: Tollie Eth, NP  ? ?No chief complaint on file. ? ?Subjective  ?  ?Belinda Brady is a 26 y.o. female who presents today as a new patient to establish care.  ?  ?Patient endorses the following concerns presently: ? ?History reviewed and reveals the following: ?Past Medical History:  ?Diagnosis Date  ? Asthma   ? Depression   ? Morbid obesity (HCC)   ? ?Past Surgical History:  ?Procedure Laterality Date  ? ADENOIDECTOMY    ? CESAREAN SECTION N/A 10/09/2013  ? Procedure: CESAREAN SECTION;  Surgeon: Esmeralda Arthur, MD;  Location: [facility];  Service: Obstetrics;  Laterality: N/A;  ? TONSILLECTOMY    ? TYMPANOSTOMY    ? ?Family Status  ?Relation Name Status  ? Mother  Alive  ? Father  Alive  ? ?Family History  ?Problem Relation Age of Onset  ? Cirrhosis Father   ? ?Social History  ? ?Socioeconomic History  ? Marital status: Single  ?  Spouse name: Not on file  ? Number of children: Not on file  ? Years of education: Not on file  ? Highest education level: Not on file  ?Occupational History  ? Not on file  ?Tobacco Use  ? Smoking status: Every Day  ?  Types: Cigars  ? Smokeless tobacco: Never  ?Vaping Use  ? Vaping Use: Never used  ?Substance and Sexual Activity  ? Alcohol use: Yes  ? Drug use: No  ? Sexual activity: Yes  ?  Birth control/protection: Condom  ?Other Topics Concern  ? Not on file  ?Social History Narrative  ? Not on file  ? ?Social Determinants of Health  ? ?Financial Resource Strain: Not on file  ?Food Insecurity: Not on file  ?Transportation Needs: Not on file  ?Physical Activity: Not on file  ?Stress: Not on  file  ?Social Connections: Not on file  ? ?Outpatient Medications Prior to Visit  ?Medication Sig  ? Acetaminophen-Caff-Pyrilamine (MIDOL COMPLETE) 500-60-15 MG TABS Take 2 tablets by mouth daily as needed (cramping).  ? albuterol (PROVENTIL HFA;VENTOLIN HFA) 108 (90 Base) MCG/ACT inhaler Inhale 2 puffs into the lungs every 4 (four) hours as needed for wheezing or shortness of breath.  ? famotidine (PEPCID) 20 MG tablet Take 1 tablet (20 mg total) by mouth 2 (two) times daily.  ? methocarbamol (ROBAXIN) 500 MG tablet Take 2 tablets (1,000 mg total) by mouth 4 (four) times daily.  ? omeprazole (PRILOSEC) 20 MG capsule Take 1 capsule (20 mg total) by mouth daily.  ? predniSONE (DELTASONE) 50 MG tablet 1 tab po daily for 6 days. Start evening on 07/28/2016. Take with food. (Patient not taking: Reported on 01/15/2019)  ? simethicone (GAS-X) 80 MG chewable tablet Chew 160 mg by mouth every 6 (six) hours as needed for flatulence.  ? ?No facility-administered medications prior to visit.  ? ?No Known Allergies ?Immunization History  ?Administered Date(s) Administered  ? Tdap 10/10/2013  ? ? ?Health Maintenance Due: ?Health Maintenance  ?Topic Date Due  ? COVID-19 Vaccine (1) Never done  ? HPV VACCINES (1 - 2-dose series) Never done  ?  Hepatitis C Screening  Never done  ? PAP-Cervical Cytology Screening  Never done  ? PAP SMEAR-Modifier  Never done  ? INFLUENZA VACCINE  Never done  ? TETANUS/TDAP  10/11/2023  ? HIV Screening  Completed  ? ? ?Review of Systems ?All review of systems negative except what is listed in the HPI ? ? Objective  ?  ?There were no vitals taken for this visit. ?Physical Exam ? ?No results found for any visits on 10/20/21. ? Assessment & Plan   ?  ? ?Problem List Items Addressed This Visit   ?None ? ? ? ?No follow-ups on file.  ?  ? ?Time: ***minutes, >50% spent counseling, care coordination, chart review, and documentation.  ? ? ?Shenouda Genova, Sung Amabile, NP, DNP, AGNP-C ?Primary Care & Sports Medicine at  [facility] ?[facility]  ? ?

## 2021-10-27 ENCOUNTER — Encounter (HOSPITAL_BASED_OUTPATIENT_CLINIC_OR_DEPARTMENT_OTHER): Payer: Self-pay | Admitting: Nurse Practitioner

## 2021-11-07 ENCOUNTER — Emergency Department (HOSPITAL_BASED_OUTPATIENT_CLINIC_OR_DEPARTMENT_OTHER)
Admission: EM | Admit: 2021-11-07 | Discharge: 2021-11-07 | Disposition: A | Discharge status: Home / Self Care | Payer: Medicaid Other | Attending: Emergency Medicine | Admitting: Emergency Medicine

## 2021-11-07 ENCOUNTER — Other Ambulatory Visit: Payer: Self-pay

## 2021-11-07 ENCOUNTER — Encounter (HOSPITAL_BASED_OUTPATIENT_CLINIC_OR_DEPARTMENT_OTHER): Payer: Self-pay | Admitting: Obstetrics and Gynecology

## 2021-11-07 DIAGNOSIS — F339 Major depressive disorder, recurrent, unspecified: Secondary | ICD-10-CM

## 2021-11-07 DIAGNOSIS — D72829 Elevated white blood cell count, unspecified: Secondary | ICD-10-CM | POA: Diagnosis not present

## 2021-11-07 DIAGNOSIS — F3340 Major depressive disorder, recurrent, in remission, unspecified: Secondary | ICD-10-CM | POA: Insufficient documentation

## 2021-11-07 DIAGNOSIS — F329 Major depressive disorder, single episode, unspecified: Secondary | ICD-10-CM | POA: Diagnosis present

## 2021-11-07 LAB — COMPREHENSIVE METABOLIC PANEL
ALT: 8 U/L (ref 0–44)
AST: 10 U/L — ABNORMAL LOW (ref 15–41)
Albumin: 4.4 g/dL (ref 3.5–5.0)
Alkaline Phosphatase: 50 U/L (ref 38–126)
Anion gap: 9 (ref 5–15)
BUN: 9 mg/dL (ref 6–20)
CO2: 22 mmol/L (ref 22–32)
Calcium: 9.7 mg/dL (ref 8.9–10.3)
Chloride: 108 mmol/L (ref 98–111)
Creatinine, Ser: 0.82 mg/dL (ref 0.44–1.00)
GFR, Estimated: >60 mL/min (ref >60)
Glucose, Bld: 89 mg/dL (ref 70–99)
Potassium: 4.1 mmol/L (ref 3.5–5.1)
Sodium: 139 mmol/L (ref 135–145)
Total Bilirubin: 0.4 mg/dL (ref 0.3–1.2)
Total Protein: 7.6 g/dL (ref 6.5–8.1)

## 2021-11-07 LAB — CBC
HCT: 42.4 % (ref 36.0–46.0)
Hemoglobin: 13 g/dL (ref 12.0–15.0)
MCH: 26.4 pg (ref 26.0–34.0)
MCHC: 30.7 g/dL (ref 30.0–36.0)
MCV: 86.2 fL (ref 80.0–100.0)
Platelets: 217 10*3/uL (ref 150–400)
RBC: 4.92 MIL/uL (ref 3.87–5.11)
RDW: 15.1 % (ref 11.5–15.5)
WBC: 11.4 10*3/uL — ABNORMAL HIGH (ref 4.0–10.5)
nRBC: 0 % (ref 0.0–0.2)

## 2021-11-07 LAB — RAPID URINE DRUG SCREEN, HOSP PERFORMED
Amphetamines: NOT DETECTED
Barbiturates: NOT DETECTED
Benzodiazepines: NOT DETECTED
Cocaine: NOT DETECTED
Opiates: NOT DETECTED
Tetrahydrocannabinol: POSITIVE — AB

## 2021-11-07 LAB — PREGNANCY, URINE: Preg Test, Ur: NEGATIVE

## 2021-11-07 LAB — ETHANOL: Alcohol, Ethyl (B): <10 mg/dL (ref <10)

## 2021-11-07 NOTE — ED Triage Notes (Signed)
Patient reports to the ER for depression. Patient reports she was diagnosed with MDD and social anxiety when she was 14 and was on medication and got pregnant at 18 and has not been on any medication since. Patient reports she has been feeling helpless and having passive suicidal thoughts. Patient reports she has these thoughts but does not feel like she could go through with harming herself as she has an 28 year old child. Patient reports she feels detatched and like nothing makes her happy.  ?

## 2021-11-07 NOTE — Discharge Instructions (Addendum)
Your lab work-up today was reassuring.  Please follow-up with behavioral health urgent care, information provided above.  You can walk into the urgent care and have an assessment, they may be able to start you on Zoloft from there and they will help arrange outpatient follow-up and provide resources. ?

## 2021-11-07 NOTE — ED Provider Notes (Signed)
?[facility]PT ?Provider Note ? ? ?CSN: [id number] ?Arrival date & time: 11/07/21  1455 ? ?  ? ?History ? ?Chief Complaint  ?Patient presents with  ? Depression  ?   ?  ? ? ?Belinda Brady is a 26 y.o. female. ? ?The history is provided by the patient.  ?Depression ? ? ?Patient with history of MDD presents with depression.  States that she has been struggling with this for about a decade, she was on Zoloft previously but had to discontinue when she was pregnant.  She was never restarted on it, no history of suicidal attempts.  She states that she is having passive SI without a plan, states she feels overwhelmed and detached from everything in her life.  She recently lost her job which is contributing to her stress.  He does not currently have a primary care, states she is in the process of setting an appointment to be seen in the next few weeks.  She denies any suicidal plan, homicidal ideations, delusions, hallucinations.  ? ?Home Medications ?Prior to Admission medications   ?Medication Sig Start Date End Date Taking? Authorizing Provider  ?Acetaminophen-Caff-Pyrilamine (MIDOL COMPLETE) 500-60-15 MG TABS Take 2 tablets by mouth daily as needed (cramping).    [provider]  ?albuterol (PROVENTIL HFA;VENTOLIN HFA) 108 (90 Base) MCG/ACT inhaler Inhale 2 puffs into the lungs every 4 (four) hours as needed for wheezing or shortness of breath. 07/27/16   [name], NP  ?famotidine (PEPCID) 20 MG tablet Take 1 tablet (20 mg total) by mouth 2 (two) times daily. 01/15/19   Loren Racer, MD  ?methocarbamol (ROBAXIN) 500 MG tablet Take 2 tablets (1,000 mg total) by mouth 4 (four) times daily. 06/28/19   Renne Crigler, PA-C  ?omeprazole (PRILOSEC) 20 MG capsule Take 1 capsule (20 mg total) by mouth daily. 06/28/19   Renne Crigler, PA-C  ?predniSONE (DELTASONE) 50 MG tablet 1 tab po daily for 6 days. Start evening on 07/28/2016. Take with food. ?Patient not taking: Reported on  01/15/2019 07/27/16   [name], NP  ?simethicone (GAS-X) 80 MG chewable tablet Chew 160 mg by mouth every 6 (six) hours as needed for flatulence.    [provider]  ?norgestimate-ethinyl estradiol (SPRINTEC 28) 0.25-35 MG-MCG tablet Take 1 tablet by mouth daily. ?Patient not taking: Reported on 10/10/2015 10/12/13 02/07/16  Nigel Bridgeman, CNM  ?   ? ?Allergies    ?Patient has no known allergies.   ? ?Review of Systems   ?Review of Systems  ?Psychiatric/Behavioral:  Positive for depression.   ? ?Physical Exam ?Updated Vital Signs ?BP (!) 134/95   Pulse 99   Temp 97.8 ?F (36.6 ?C)   Resp 16   Ht 5\' 7"  (1.702 m)   Wt (!) 143.8 kg   LMP 10/08/2021 (Approximate)   SpO2 100%   BMI 49.65 kg/m?  ?Physical Exam ?Vitals and nursing note reviewed. Exam conducted with a chaperone present.  ?Constitutional:   ?   Appearance: Normal appearance.  ?HENT:  ?   Head: Normocephalic and atraumatic.  ?Eyes:  ?   General: No scleral icterus.    ?   Right eye: No discharge.     ?   Left eye: No discharge.  ?   Extraocular Movements: Extraocular movements intact.  ?   Pupils: Pupils are equal, round, and reactive to light.  ?Cardiovascular:  ?   Rate and Rhythm: Normal rate and regular rhythm.  ?   Pulses: Normal pulses.  ?  Heart sounds: Normal heart sounds. No murmur heard. ?  No friction rub. No gallop.  ?Pulmonary:  ?   Effort: Pulmonary effort is normal. No respiratory distress.  ?   Breath sounds: Normal breath sounds.  ?Abdominal:  ?   General: Abdomen is flat. Bowel sounds are normal. There is no distension.  ?   Palpations: Abdomen is soft.  ?   Tenderness: There is no abdominal tenderness.  ?Skin: ?   General: Skin is warm and dry.  ?   Coloration: Skin is not jaundiced.  ?Neurological:  ?   Mental Status: She is alert. Mental status is at baseline.  ?   Coordination: Coordination normal.  ?Psychiatric:  ?   Comments: flat affect but hopeful  ? ? ?ED Results / Procedures / Treatments   ?Labs ?(all labs ordered  are listed, but only abnormal results are displayed) ?Labs Reviewed  ?COMPREHENSIVE METABOLIC PANEL - Abnormal; Notable for the following components:  ?    Result Value  ? AST 10 (*)   ? All other components within normal limits  ?CBC - Abnormal; Notable for the following components:  ? WBC 11.4 (*)   ? All other components within normal limits  ?RAPID URINE DRUG SCREEN, HOSP PERFORMED - Abnormal; Notable for the following components:  ? Tetrahydrocannabinol POSITIVE (*)   ? All other components within normal limits  ?ETHANOL  ?PREGNANCY, URINE  ? ? ?EKG ?None ? ?Radiology ?No results found. ? ?Procedures ?Procedures  ? ? ?Medications Ordered in ED ?Medications - No data to display ? ?ED Course/ Medical Decision Making/ A&P ?  ?                        ?Medical Decision Making ?Amount and/or Complexity of Data Reviewed ?Labs: ordered. ? ? ?This is a 26 year old presenting today with depression.  She does have passive suicidal ideations but she has no plan.  Differential diagnosis includes behavioral health concerns. ? ?Patient's medical work-up today is unremarkable, she does have a mild leukocytosis at 11.2 but she is not anemic, there is no gross electrolyte derangement or hepatic dysfunction.  UDS notable for THC but otherwise grossly unremarkable. ? ?I considered starting the patient on Zoloft but due to risk for increase suicide after starting medicine I do not think he would be responsible for me to start her on this medicine after one-time visit in the ED. I do not feel she needs inpatient medical treatment, patient is agreeable without any plans of harming herself and has contracted for safety.  I do think she would benefit from behavioral health resources as well as from a psychiatric evaluation today but I do not think she needs to be IVCed.    ? ?I reached out to [facility] and spoke with Dorann Lodge, NP. Patient does need to be transferred but Ms. Effie Shy advises walk-[facility] assessment at the [facility] today.  I  appreciate her consult for this patient. ? ?Discussed plan with patient, she will go straight from ED to behavioral health urgent care for assessment. ? ? ? ? ? ? ? ?Final Clinical Impression(s) / ED Diagnoses ?Final diagnoses:  ?Recurrent major depressive disorder, remission status unspecified (HCC)  ? ? ?Rx / DC Orders ?ED Discharge Orders   ? ? None  ? ?  ? ? ?  ?Theron Arista, PA-C ?11/07/21 2043 ? ?  ?Cheryll Cockayne, MD ?11/07/21 2323 ? ?

## 2021-11-24 ENCOUNTER — Other Ambulatory Visit (HOSPITAL_BASED_OUTPATIENT_CLINIC_OR_DEPARTMENT_OTHER): Payer: Self-pay

## 2021-11-24 ENCOUNTER — Ambulatory Visit (INDEPENDENT_AMBULATORY_CARE_PROVIDER_SITE_OTHER): Payer: Medicaid Other | Admitting: Nurse Practitioner

## 2021-11-24 ENCOUNTER — Encounter (HOSPITAL_BASED_OUTPATIENT_CLINIC_OR_DEPARTMENT_OTHER): Payer: Self-pay | Admitting: Nurse Practitioner

## 2021-11-24 VITALS — BP 112/72 | HR 86 | Ht 67.0 in | Wt 325.2 lb

## 2021-11-24 DIAGNOSIS — R21 Rash and other nonspecific skin eruption: Secondary | ICD-10-CM

## 2021-11-24 DIAGNOSIS — Z1329 Encounter for screening for other suspected endocrine disorder: Secondary | ICD-10-CM

## 2021-11-24 DIAGNOSIS — Z13228 Encounter for screening for other metabolic disorders: Secondary | ICD-10-CM

## 2021-11-24 DIAGNOSIS — F331 Major depressive disorder, recurrent, moderate: Secondary | ICD-10-CM

## 2021-11-24 DIAGNOSIS — Z130 Encounter for screening for diseases of the blood and blood-forming organs and certain disorders involving the immune mechanism: Secondary | ICD-10-CM

## 2021-11-24 DIAGNOSIS — F411 Generalized anxiety disorder: Secondary | ICD-10-CM | POA: Diagnosis not present

## 2021-11-24 DIAGNOSIS — Z1321 Encounter for screening for nutritional disorder: Secondary | ICD-10-CM

## 2021-11-24 MED ORDER — HYDROXYZINE PAMOATE 25 MG PO CAPS
25.0000 mg | ORAL_CAPSULE | Freq: Every evening | ORAL | 3 refills | Status: AC | PRN
  Filled 2021-11-24: qty 60, 30d supply, fill #0

## 2021-11-24 MED ORDER — ESCITALOPRAM OXALATE 20 MG PO TABS
ORAL_TABLET | ORAL | 3 refills | Status: DC
  Filled 2021-11-24: qty 30, 30d supply, fill #0

## 2021-11-24 MED ORDER — CLOTRIMAZOLE-BETAMETHASONE 1-0.05 % EX CREA
1.0000 "application " | TOPICAL_CREAM | Freq: Every day | CUTANEOUS | 5 refills | Status: AC
  Filled 2021-11-24 – 2021-12-12 (×2): qty 30, 30d supply, fill #0

## 2021-11-24 NOTE — Progress Notes (Signed)
?Tollie Eth, DNP, AGNP-c ?Primary Care & Sports Medicine ?[address]  [address] ?[place], [place] [address] ?[phone] ? ?New patient visit ? ? ?Patient: Belinda Brady   DOB: 04-27-1996   25 y.o. Female  MRN: [id number] ?Visit Date: 11/24/2021 ? ?Patient Care Team: ?Gedeon Brandow, Sung Amabile, NP as PCP - General (Nurse Practitioner) ? ?Today's Vitals  ? 11/24/21 1633  ?BP: 112/72  ?Pulse: 86  ?SpO2: 97%  ?Weight: (!) 325 lb 3.2 oz (147.5 kg)  ?Height: 5\' 7"  (1.702 m)  ? ?Body mass index is 50.93 kg/m?.  ? ?Today's healthcare provider: , NP  ? ?Chief Complaint  ?Patient presents with  ? Establish Care  ? Obesity  ? Depression  ? Rash  ? ?Subjective  ?  ?Belinda Brady is a 26 y.o. female who presents today as a new patient to establish care.  ?  ?Patient endorses the following concerns presently: ?Depression ?- Endorses symptoms of depression with sadness, fatigue, difficulty concentrating, insomnia, tearfulness ?-Tells me that she would like to be more present for her young daughter and this is very difficult to do with her current symptoms ?-Previous diagnosis of major depressive disorder at 26 years old ?-Was previously on medication management for this but stopped medication when she got pregnant ?-Has not been on any medications or seen counseling in the last several years ?-Feels that her symptoms are interfering with her quality of life ?-Excellent support from family ?-No SI/HI today ? ?Weight management ?- Difficulty losing weight chronically ?- Has a hard time managing diet and exercise routine ?-No history of known diabetes or cardiovascular concerns ?-Has never tried medication for this in the past ? ?Rash ?-Flaky, itchy rash on abdomen and high friction surfaces ?-Chronic in nature ?-Has not tried anything for this in the past, but does notice this improves with decreased frixtion ?-No erythema, warmth, drainage present ?- in the past pustules have formed ? ?History  reviewed and reveals the following: ?Past Medical History:  ?Diagnosis Date  ? Asthma   ? Depression   ? Morbid obesity (HCC)   ? ?Past Surgical History:  ?Procedure Laterality Date  ? ADENOIDECTOMY    ? CESAREAN SECTION N/A 10/09/2013  ? Procedure: CESAREAN SECTION;  Surgeon: 12/09/2013, MD;  Location: [facility];  Service: Obstetrics;  Laterality: N/A;  ? TONSILLECTOMY    ? TYMPANOSTOMY    ? ?Family Status  ?Relation Name Status  ? Mother  Alive  ? Father  Alive  ? ?Family History  ?Problem Relation Age of Onset  ? Cirrhosis Father   ? ?Social History  ? ?Socioeconomic History  ? Marital status: Single  ?  Spouse name: Not on file  ? Number of children: Not on file  ? Years of education: Not on file  ? Highest education level: Not on file  ?Occupational History  ? Not on file  ?Tobacco Use  ? Smoking status: Every Day  ?  Types: Cigars  ? Smokeless tobacco: Never  ?Vaping Use  ? Vaping Use: Never used  ?Substance and Sexual Activity  ? Alcohol use: Yes  ? Drug use: No  ? Sexual activity: Yes  ?  Birth control/protection: Condom  ?Other Topics Concern  ? Not on file  ?Social History Narrative  ? Not on file  ? ?Social Determinants of Health  ? ?Financial Resource Strain: Not on file  ?Food Insecurity: Not on file  ?Transportation Needs: Not on file  ?  Physical Activity: Not on file  ?Stress: Not on file  ?Social Connections: Not on file  ? ?Outpatient Medications Prior to Visit  ?Medication Sig  ? [DISCONTINUED] Acetaminophen-Caff-Pyrilamine (MIDOL COMPLETE) 500-60-15 MG TABS Take 2 tablets by mouth daily as needed (cramping). (Patient not taking: Reported on 11/24/2021)  ? [DISCONTINUED] albuterol (PROVENTIL HFA;VENTOLIN HFA) 108 (90 Base) MCG/ACT inhaler Inhale 2 puffs into the lungs every 4 (four) hours as needed for wheezing or shortness of breath. (Patient not taking: Reported on 11/24/2021)  ? [DISCONTINUED] famotidine (PEPCID) 20 MG tablet Take 1 tablet (20 mg total) by mouth 2 (two) times daily. (Patient not  taking: Reported on 11/24/2021)  ? [DISCONTINUED] methocarbamol (ROBAXIN) 500 MG tablet Take 2 tablets (1,000 mg total) by mouth 4 (four) times daily. (Patient not taking: Reported on 11/24/2021)  ? [DISCONTINUED] omeprazole (PRILOSEC) 20 MG capsule Take 1 capsule (20 mg total) by mouth daily. (Patient not taking: Reported on 11/24/2021)  ? [DISCONTINUED] predniSONE (DELTASONE) 50 MG tablet 1 tab po daily for 6 days. Start evening on 07/28/2016. Take with food. (Patient not taking: Reported on 01/15/2019)  ? [DISCONTINUED] simethicone (MYLICON) 80 MG chewable tablet Chew 160 mg by mouth every 6 (six) hours as needed for flatulence. (Patient not taking: Reported on 11/24/2021)  ? ?No facility-administered medications prior to visit.  ? ?No Known Allergies ?Immunization History  ?Administered Date(s) Administered  ? Tdap 10/10/2013  ? ? ?Review of Systems ?All review of systems negative except what is listed in the HPI ? ? Objective  ?  ?BP 112/72   Pulse 86   Ht 5\' 7"  (1.702 m)   Wt (!) 325 lb 3.2 oz (147.5 kg)   SpO2 97%   BMI 50.93 kg/m?  ?Physical Exam ?Vitals and nursing note reviewed.  ?Constitutional:   ?   General: She is not in acute distress. ?   Appearance: Normal appearance.  ?Eyes:  ?   Extraocular Movements: Extraocular movements intact.  ?   Conjunctiva/sclera: Conjunctivae normal.  ?   Pupils: Pupils are equal, round, and reactive to light.  ?Neck:  ?   Vascular: No carotid bruit.  ?Cardiovascular:  ?   Rate and Rhythm: Normal rate and regular rhythm.  ?   Pulses: Normal pulses.  ?   Heart sounds: Normal heart sounds. No murmur heard. ?Pulmonary:  ?   Effort: Pulmonary effort is normal.  ?   Breath sounds: Normal breath sounds. No wheezing.  ?Abdominal:  ?   General: Bowel sounds are normal.  ?   Palpations: Abdomen is soft.  ?Musculoskeletal:     ?   General: Normal range of motion.  ?   Cervical back: Normal range of motion.  ?   Right lower leg: No edema.  ?   Left lower leg: No edema.  ?Skin: ?    General: Skin is warm and dry.  ?   Capillary Refill: Capillary refill takes less than 2 seconds.  ?   Findings: Rash present.  ?   Comments: Abdomen, axilla, groin  ?Neurological:  ?   General: No focal deficit present.  ?   Mental Status: She is alert and oriented to person, place, and time.  ?Psychiatric:     ?   Mood and Affect: Mood normal.     ?   Behavior: Behavior normal.     ?   Thought Content: Thought content normal.     ?   Judgment: Judgment normal.  ? ? ?No results found  for any visits on 11/24/21. ? Assessment & Plan   ?  ? ?Problem List Items Addressed This Visit   ? ? Morbid obesity (HCC)  ?  BMI 50.93% today.  Difficulty losing weight.  No recent labs.  Has chronically been overweight most of her life but has had increased difficulty with this over the last few years. ?Discussed with patient that we will obtain labs today for evaluation to ensure there are no underlying conditions that are currently presently Contributing to her increased weight. ?Diet and exercise recommendations provided on AVS ?Patient would be a good candidate for GLP-1 medication to help manage weight with a significantly elevated BMI. ?We will determine if she is eligible for this once we have labs back and can review her insurance coverage ? ?  ?  ? Relevant Orders  ? CBC with Differential  ? Comprehensive metabolic panel  ? Lipid Profile  ? TSH  ? HgB A1c  ? Vitamin B12  ? Vitamin D 1,25 dihydroxy  ? Depression - Primary  ?  Symptoms and presentation consistent with major depressive disorder, recurrent.  She is not currently on any medication at this time.  Discussed medication options with patient and recommendations for counseling services which may be helpful to control her symptoms.  She is interested in both today.  Given her current symptoms I do feel that escitalopram would be a good option to trial.  No alarm symptoms present today. ?We will plan to send in medication and follow-up in the next couple of weeks to  ensure that medication has been effective.  She will follow-up sooner if she begins to experience any side effect or worsening symptoms. ? ?  ?  ? Relevant Medications  ? escitalopram (LEXAPRO) 20 MG tablet  ? hydr

## 2021-11-24 NOTE — Patient Instructions (Addendum)
Thank you for choosing [place] at [facility] for your Primary Care needs. I am excited for the opportunity to partner with you to meet your health care goals. It was a pleasure meeting you today! ? ?Recommendations from today's visit: ?I have sent the medication to the pharmacy for you ?I will send the referral to the counselor for you- they will call you to schedule  ? ?Information on diet, exercise, and health maintenance recommendations are listed below. This is information to help you be sure you are on track for optimal health and monitoring.  ? ?Please look over this and let us know if you have any questions or if you have completed any of the health maintenance outside of [facility] so that we can be sure your records are up to date.  ?___________________________________________________________ ?About Me: ?I am an Adult-Geriatric Nurse Practitioner with a background in caring for patients for more than 20 years with a strong intensive care background. I provide primary care and sports medicine services to patients age 73 and older within this office. My education had a strong focus on caring for the older adult population, which I am passionate about. I am also the director of the APP Fellowship with [facility].  ? ?My desire is to provide you with the best service through preventive medicine and supportive care. I consider you a part of the medical team and value your input. I work diligently to ensure that you are heard and your needs are met in a safe and effective manner. I want you to feel comfortable with me as your provider and want you to know that your health concerns are important to me. ? ?For your information, our office hours are: ?Monday, Tuesday, and Thursday 8:00 AM - 5:00 PM ?Wednesday and Friday 8:00 AM - 12:00 PM.  ? ?In my time away from the office I am teaching new APP's within the system and am unavailable, but my partner, Dr. Burnard Bunting is in the office for  emergent needs.  ? ?If you have questions or concerns, please call our office at [phone] or send [place] a MyChart message and we will respond as quickly as possible.  ?____________________________________________________________ ?MyChart:  ?For all urgent or time sensitive needs we ask that you please call the office to avoid delays. Our number is [phone]. ?MyChart is not constantly monitored and due to the large volume of messages a day, replies may take up to 72 business hours. ? ?MyChart Policy: ?MyChart allows for you to see your visit notes, after visit summary, provider recommendations, lab and tests results, make an appointment, request refills, and contact your provider or the office for non-urgent questions or concerns. Providers are seeing patients during normal business hours and do not have built in time to review MyChart messages.  ?We ask that you allow a minimum of 3 business days for responses to [employer]. For this reason, please do not send urgent requests through [place]. Please call the office at [phone]. ?New and ongoing conditions may require a visit. We have virtual and in person visit available for your convenience.  ?Complex MyChart concerns may require a visit. Your provider may request you schedule a virtual or in person visit to ensure we are providing the best care possible. ?MyChart messages sent after 11:00 AM on Friday will not be received by the provider until Monday morning.  ?  ?Lab and Test Results: ?You will receive your lab and test results on MyChart  as soon as they are completed and results have been sent by the lab or testing facility. Due to this service, you will receive your results BEFORE your provider.  ?I review lab and tests results each morning prior to seeing patients. Some results require collaboration with other providers to ensure you are receiving the most appropriate care. For this reason, we ask that you please allow a minimum of 3-5  business days from the time the ALL results have been received for your provider to receive and review lab and test results and contact you about these.  ?Most lab and test result comments from the provider will be sent through [place]. Your provider may recommend changes to the plan of care, follow-up visits, repeat testing, ask questions, or request an office visit to discuss these results. You may reply directly to this message or call the office at [phone] to provide information for the provider or set up an appointment. ?In some instances, you will be called with test results and recommendations. Please let us know if this is preferred and we will make note of this in your chart to provide this for you.    ?If you have not heard a response to your lab or test results in 5 business days from all results returning to [place], please call the office to let us know. We ask that you please avoid calling prior to this time unless there is an emergent concern. Due to high call volumes, this can delay the resulting process. ? ?After Hours: ?For all non-emergency after hours needs, please call the office at [phone] and select the option to reach the on-call provider service. On-call services are shared between multiple [place] offices and therefore it will not be possible to speak directly with your provider. On-call providers may provide medical advice and recommendations, but are unable to provide refills for maintenance medications.  ?For all emergency or urgent medical needs after normal business hours, we recommend that you seek care at the closest Urgent Care or Emergency Department to ensure appropriate treatment in a timely manner.  ?[facility] has a 24 hour emergency room located on the ground floor for your convenience.  ? ?Urgent Concerns During the Business Day ?Providers are seeing patients from 8AM to [place] with a busy schedule and are most often not able to respond to  non-urgent calls until the end of the day or the next business day. ?If you should have URGENT concerns during the day, please call and speak to the nurse or schedule a same day appointment so that we can address your concern without delay.  ? ?Thank you, again, for choosing me as your health care partner. I appreciate your trust and look forward to learning more about you.  ? ?Worthy Keeler, DNP, AGNP-c ?___________________________________________________________ ? ?Health Maintenance Recommendations ?Screening Testing ?Mammogram ?Every 1 -2 years based on history and risk factors ?Starting at age 67 ?Pap Smear ?Ages 21-39 every 3 years ?Ages 48-65 every 5 years with HPV testing ?More frequent testing may be required based on results and history ?Colon Cancer Screening ?Every 1-10 years based on test performed, risk factors, and history ?Starting at age 90 ?Bone Density Screening ?Every 2-10 years based on history ?Starting at age 36 for women ?Recommendations for men differ based on medication usage, history, and risk factors ?AAA Screening ?One time ultrasound ?Men 41-38 years old who have every smoked ?Lung Cancer Screening ?Low Dose Lung CT every 12 months ?Age 72-80  years with a 30 pack-year smoking history who still smoke or who have quit within the last 15 years ? ?Screening Labs ?Routine  Labs: Complete Blood Count (CBC), Complete Metabolic Panel (CMP), Cholesterol (Lipid Panel) ?Every 6-12 months based on history and medications ?May be recommended more frequently based on current conditions or previous results ?Hemoglobin A1c Lab ?Every 3-12 months based on history and previous results ?Starting at age 79 or earlier with diagnosis of diabetes, high cholesterol, BMI >26, and/or risk factors ?Frequent monitoring for patients with diabetes to ensure blood sugar control ?Thyroid Panel (TSH w/ T3 & T4) ?Every 6 months based on history, symptoms, and risk factors ?May be repeated more often if on  medication ?HIV ?One time testing for all patients 28 and older ?May be repeated more frequently for patients with increased risk factors or exposure ?Hepatitis C ?One time testing for all patients 31 and older ?May be repeated

## 2021-11-25 ENCOUNTER — Ambulatory Visit (HOSPITAL_BASED_OUTPATIENT_CLINIC_OR_DEPARTMENT_OTHER): Payer: Medicaid Other

## 2021-12-04 DIAGNOSIS — F411 Generalized anxiety disorder: Secondary | ICD-10-CM | POA: Insufficient documentation

## 2021-12-04 DIAGNOSIS — R21 Rash and other nonspecific skin eruption: Secondary | ICD-10-CM | POA: Insufficient documentation

## 2021-12-04 DIAGNOSIS — L2082 Flexural eczema: Secondary | ICD-10-CM | POA: Insufficient documentation

## 2021-12-04 HISTORY — DX: Rash and other nonspecific skin eruption: R21

## 2021-12-04 NOTE — Assessment & Plan Note (Signed)
Psoriasiform scaling rash present to flexural surfaces of upper extremities and lower extremities over erythematous base.  Suspect eczematous reaction.  No vesicles, pustules, crusts present.  There is evidence of excoriation.  We will send treatment with topical medication today for ?

## 2021-12-04 NOTE — Assessment & Plan Note (Signed)
Symptoms and presentation consistent with major depressive disorder, recurrent.  She is not currently on any medication at this time.  Discussed medication options with patient and recommendations for counseling services which may be helpful to control her symptoms.  She is interested in both today.  Given her current symptoms I do feel that escitalopram would be a good option to trial.  No alarm symptoms present today. ?We will plan to send in medication and follow-up in the next couple of weeks to ensure that medication has been effective.  She will follow-up sooner if she begins to experience any side effect or worsening symptoms. ?

## 2021-12-04 NOTE — Assessment & Plan Note (Signed)
Symptoms and presentation consistent with generalized anxiety disorder in the setting of major depressive disorder. ?Discussion of counseling and medication with patient today.  She is interested in both modalities to help control her symptoms. ?We will plan to start Lexapro today and send referral for counseling services as I do feel these would be exponentially beneficial for her. ?No SI/HI or other alarm symptoms present today. ?We will plan to follow-up in the next few weeks to see how medication is working for her.  She is aware that she can contact the office at any time she has had negative side effects or new or worsening symptoms should present. ?

## 2021-12-04 NOTE — Assessment & Plan Note (Signed)
Erythematous rash present over abdomen at waistline and high friction surfaces such as axilla.  Appearance of excoriation present with no drainage or warmth.  Suspect combination of folliculitis from fungal etiology and increased friction contributing to the symptoms.  We will send treatment today with topical medication to see if this is helpful for symptoms.  Recommend use of powder to help keep the area dry and avoid wearing tight fitting close that may restrict bruisability of the skin.  Patient will follow-up if symptoms worsen or fail to improve. ?

## 2021-12-04 NOTE — Assessment & Plan Note (Signed)
BMI 50.93% today.  Difficulty losing weight.  No recent labs.  Has chronically been overweight most of her life but has had increased difficulty with this over the last few years. ?Discussed with patient that we will obtain labs today for evaluation to ensure there are no underlying conditions that are currently presently Contributing to her increased weight. ?Diet and exercise recommendations provided on AVS ?Patient would be a good candidate for GLP-1 medication to help manage weight with a significantly elevated BMI. ?We will determine if she is eligible for this once we have labs back and can review her insurance coverage ?

## 2021-12-06 ENCOUNTER — Other Ambulatory Visit (HOSPITAL_BASED_OUTPATIENT_CLINIC_OR_DEPARTMENT_OTHER): Payer: Self-pay

## 2021-12-12 ENCOUNTER — Other Ambulatory Visit (HOSPITAL_BASED_OUTPATIENT_CLINIC_OR_DEPARTMENT_OTHER): Payer: Self-pay

## 2021-12-22 ENCOUNTER — Encounter (HOSPITAL_BASED_OUTPATIENT_CLINIC_OR_DEPARTMENT_OTHER): Payer: Self-pay | Admitting: Nurse Practitioner

## 2022-01-02 ENCOUNTER — Ambulatory Visit (HOSPITAL_BASED_OUTPATIENT_CLINIC_OR_DEPARTMENT_OTHER): Payer: Medicaid Other

## 2022-01-03 ENCOUNTER — Ambulatory Visit (HOSPITAL_BASED_OUTPATIENT_CLINIC_OR_DEPARTMENT_OTHER): Payer: Medicaid Other

## 2022-10-06 ENCOUNTER — Encounter (HOSPITAL_BASED_OUTPATIENT_CLINIC_OR_DEPARTMENT_OTHER): Payer: Self-pay

## 2022-10-06 ENCOUNTER — Other Ambulatory Visit: Payer: Self-pay

## 2022-10-06 ENCOUNTER — Emergency Department (HOSPITAL_BASED_OUTPATIENT_CLINIC_OR_DEPARTMENT_OTHER)
Admission: EM | Admit: 2022-10-06 | Discharge: 2022-10-06 | Disposition: A | Discharge status: Home / Self Care | Payer: Medicaid Other | Attending: Emergency Medicine | Admitting: Emergency Medicine

## 2022-10-06 DIAGNOSIS — K029 Dental caries, unspecified: Secondary | ICD-10-CM

## 2022-10-06 DIAGNOSIS — K0501 Acute gingivitis, non-plaque induced: Secondary | ICD-10-CM | POA: Insufficient documentation

## 2022-10-06 DIAGNOSIS — K0510 Chronic gingivitis, plaque induced: Secondary | ICD-10-CM

## 2022-10-06 MED ORDER — LIDOCAINE VISCOUS HCL 2 % MT SOLN: 15.0000 mL | OROMUCOSAL | 0 refills | Status: DC | PRN

## 2022-10-06 MED ORDER — AMOXICILLIN-POT CLAVULANATE 875-125 MG PO TABS
1.0000 | ORAL_TABLET | Freq: Once | ORAL | Status: AC
  Administered 2022-10-06: 1 via ORAL
  Filled 2022-10-06: qty 1

## 2022-10-06 MED ORDER — AMOXICILLIN-POT CLAVULANATE 875-125 MG PO TABS: 1.0000 | ORAL_TABLET | Freq: Two times a day (BID) | ORAL | 0 refills | Status: DC

## 2022-10-06 MED ORDER — OXYCODONE-ACETAMINOPHEN 5-325 MG PO TABS
1.0000 | ORAL_TABLET | Freq: Once | ORAL | Status: AC
  Administered 2022-10-06: 1 via ORAL
  Filled 2022-10-06: qty 1

## 2022-10-06 NOTE — ED Triage Notes (Addendum)
Patient here POV from Home.  Endorses Mouth/Dental pain for 2 Weeks. Worsened more so the past 2 Days. Possible Bloody Drainage.   NAD noted during Triage. A&Ox4. GCS 15. Ambulatory.

## 2022-10-06 NOTE — ED Provider Notes (Signed)
[facility] Provider Note   CSN: [id number] Arrival date & time: 10/06/22  1901     History  Chief Complaint  Patient presents with   Dental Pain    Belinda Brady is a 27 y.o. female past medical history significant for obesity who presents with concern for mouth, dental pain on and off for 2 weeks, worse in the last 2 days.  She reports some bloody drainage, gum pain throughout.  She reports she does not have a dentist right now.  She is worried that she may have some gum disease or cavities causing her pain.  She is also worried about a wisdom tooth erupting on the back left.  She has been trying ibuprofen, Tylenol for pain without significant relief.   Dental Pain      Home Medications Prior to Admission medications   Medication Sig Start Date End Date Taking? Authorizing Provider  amoxicillin-clavulanate (AUGMENTIN) 875-125 MG tablet Take 1 tablet by mouth every 12 (twelve) hours. 10/06/22  Yes Ivanell Deshotel H, PA-C  lidocaine (XYLOCAINE) 2 % solution Use as directed 15 mLs in the mouth or throat as needed for mouth pain. 10/06/22  Yes Anaisabel Pederson H, PA-C  clotrimazole-betamethasone (LOTRISONE) cream Apply 1 application topically daily. 11/24/21   Orma Render, NP  escitalopram (LEXAPRO) 20 MG tablet One half tab daily for a week then one tab by mouth daily 11/24/21   Early, Coralee Pesa, NP  hydrOXYzine (VISTARIL) 25 MG capsule Take 1 capsule (25 mg total) by mouth at bedtime and may repeat dose one time if needed. 11/24/21   Orma Render, NP  norgestimate-ethinyl estradiol (SPRINTEC 28) 0.25-35 MG-MCG tablet Take 1 tablet by mouth daily. Patient not taking: Reported on 10/10/2015 10/12/13 02/07/16  Donnel Saxon, CNM      Allergies    Patient has no known allergies.    Review of Systems   Review of Systems  HENT:  Positive for dental problem.   All other systems reviewed and are negative.   Physical Exam Updated Vital  Signs BP (!) 133/116 (BP Location: Right Arm)   Pulse (!) 105   Temp 98.7 F (37.1 C) (Oral)   Resp 18   Ht '5\' 7"'$  (1.702 m)   Wt (!) 137 kg   SpO2 100%   BMI 47.30 kg/m  Physical Exam Vitals and nursing note reviewed.  Constitutional:      General: She is not in acute distress.    Appearance: Normal appearance.  HENT:     Head: Normocephalic and atraumatic.     Comments: Patient with halitosis, red irritated gums throughout, she has some scattered dental caries.  Her uvula is midline, she has no tonsillar swelling or exudate.  She can swallow without difficulty.  She has no floor of mouth swelling, redness, she does have a small irritated area behind the right lower canines consistent with either small periapical abscess or small hematoma.  Findings consistent with poor dentition, dental caries, gingivitis Eyes:     General:        Right eye: No discharge.        Left eye: No discharge.  Cardiovascular:     Rate and Rhythm: Normal rate and regular rhythm.  Pulmonary:     Effort: Pulmonary effort is normal. No respiratory distress.  Musculoskeletal:        General: No deformity.  Skin:    General: Skin is warm and dry.  Neurological:  Mental Status: She is alert and oriented to person, place, and time.  Psychiatric:        Mood and Affect: Mood normal.        Behavior: Behavior normal.     ED Results / Procedures / Treatments   Labs (all labs ordered are listed, but only abnormal results are displayed) Labs Reviewed - No data to display  EKG None  Radiology No results found.  Procedures Procedures    Medications Ordered in ED Medications  amoxicillin-clavulanate (AUGMENTIN) 875-125 MG per tablet 1 tablet (has no administration in time range)  oxyCODONE-acetaminophen (PERCOCET/ROXICET) 5-325 MG per tablet 1 tablet (1 tablet Oral Given 10/06/22 1958)    ED Course/ Medical Decision Making/ A&P                             Medical Decision  Making Risk Prescription drug management.    This an overall well-appearing 27 y.o. female who presents with concern for dental pain.  Physical exam reveals cavities, irritated gums.  Patient with redness, gum swelling without evidence of gum abscess, periapical abscess, PTA, uvula deviation, pharyngitis, epiglottitis, dysphonia, stridor.  Patient without difficulty swallowing.  No systemic fever, chills.  Given patient's symptoms think it is reasonable to treat with antibiotics.  Encouraged ibuprofen, Tylenol, Orajel, ice for pain control. Considered stronger pain control with percocet x 1 in the ED, think this is reasonable at this time. Encouraged urgent dental follow-up, dental resource guide provided.  Provided Augmentin for antibiotic coverage, viscous lidocaine for mouth pain.  Patient discharged in stable condition at this time, return precautions given.  Final Clinical Impression(s) / ED Diagnoses Final diagnoses:  Gingivitis  Pain due to dental caries    Rx / DC Orders ED Discharge Orders          Ordered    amoxicillin-clavulanate (AUGMENTIN) 875-125 MG tablet  Every 12 hours        10/06/22 1956    lidocaine (XYLOCAINE) 2 % solution  As needed        10/06/22 1956              Melonee Gerstel, Jackalyn Lombard 10/06/22 [place], MD 10/06/22 2329

## 2022-10-06 NOTE — ED Notes (Signed)
Assessment complete. Pt with pain throughout the mouth and bleeding from gums when brushing teeth. A dark spot is noted to inside of right mouth. Pt also reports pain starting in left back of mouth.

## 2022-10-06 NOTE — Discharge Instructions (Addendum)
Please use Tylenol or ibuprofen for pain.  You may use 600 mg ibuprofen every 6 hours or 1000 mg of Tylenol every 6 hours.  You may choose to alternate between the 2.  This would be most effective.  Not to exceed 4 g of Tylenol within 24 hours.  Not to exceed 3200 mg ibuprofen 24 hours.  

## 2023-08-30 ENCOUNTER — Ambulatory Visit: Payer: No Typology Code available for payment source | Admitting: Nurse Practitioner

## 2023-08-30 ENCOUNTER — Encounter: Payer: Self-pay | Admitting: Nurse Practitioner

## 2023-08-30 VITALS — BP 124/82 | HR 60 | Wt 291.0 lb

## 2023-08-30 DIAGNOSIS — R1030 Lower abdominal pain, unspecified: Secondary | ICD-10-CM

## 2023-08-30 DIAGNOSIS — N3090 Cystitis, unspecified without hematuria: Secondary | ICD-10-CM

## 2023-08-30 DIAGNOSIS — R8290 Unspecified abnormal findings in urine: Secondary | ICD-10-CM | POA: Diagnosis not present

## 2023-08-30 DIAGNOSIS — F331 Major depressive disorder, recurrent, moderate: Secondary | ICD-10-CM

## 2023-08-30 DIAGNOSIS — N898 Other specified noninflammatory disorders of vagina: Secondary | ICD-10-CM | POA: Diagnosis not present

## 2023-08-30 DIAGNOSIS — F1911 Other psychoactive substance abuse, in remission: Secondary | ICD-10-CM

## 2023-08-30 LAB — POCT URINALYSIS DIP (CLINITEK)
Glucose, UA: NEGATIVE mg/dL
Leukocytes, UA: NEGATIVE
Nitrite, UA: POSITIVE — AB
POC PROTEIN,UA: NEGATIVE
Spec Grav, UA: >=1.03 — AB (ref 1.010–1.025)
Urobilinogen, UA: 1 U/dL
pH, UA: 6 (ref 5.0–8.0)

## 2023-08-30 MED ORDER — NITROFURANTOIN MONOHYD MACRO 100 MG PO CAPS: 100.0000 mg | ORAL_CAPSULE | Freq: Two times a day (BID) | ORAL | 0 refills | Status: DC

## 2023-08-30 NOTE — Assessment & Plan Note (Signed)
Presents with lower abdominal discomfort, malodorous urine, and elevated WBC with nitrites and leukocytes in the urine, consistent with UTI. No dysuria reported, which is atypical but not uncommon. Differential includes possible STD due to overlapping symptoms. Discussed the importance of STD testing to rule out co-infection. Treatment will cover both UTI and potential STD components. - Start antibiotic treatment for UTI - Perform vaginal swab to test for yeast infection, bacterial vaginosis, gonorrhea, and chlamydia - Follow up with swab results to determine further treatment if necessary

## 2023-08-30 NOTE — Assessment & Plan Note (Signed)
Has not been tested for STDs since her last sexual encounter. Focused on maintaining a healthy lifestyle for her family, including a nine-year-old child. Discussed the importance of regular health check-ups and screenings. - Perform STD testing as part of the vaginal swab - Encourage continued positive lifestyle changes and sobriety - Provide anticipatory guidance on the importance of regular health check-ups and screenings

## 2023-08-30 NOTE — Patient Instructions (Signed)
VISIT SUMMARY:  Today, we addressed your urinary symptoms and concerns about a possible UTI or STD. We also discussed your ongoing recovery from substance use and your general health maintenance.  YOUR PLAN:  -URINARY TRACT INFECTION (UTI): A UTI is an infection in any part of your urinary system. You have symptoms like lower abdominal discomfort and malodorous urine, and your lab tests show signs of infection. We will start you on antibiotics and perform a vaginal swab to test for yeast infection, bacterial vaginosis, gonorrhea, and chlamydia. We will follow up with the results to determine if further treatment is needed.  -SUBSTANCE USE DISORDER: Substance use disorder is a condition where the use of one or more substances leads to a clinically significant impairment or distress. You have been clean for 25 days and report improved mental clarity and mood. Continue using support systems like the [employer], journaling, and self-reflection. Be aware of the risks of relapse and overdose. I am SO proud of the decision you made for sobriety, and I believe in you!  -GENERAL HEALTH MAINTENANCE: Maintaining your general health includes regular check-ups and screenings. We will perform STD testing as part of the vaginal swab. Continue with your positive lifestyle changes and sobriety and remember the importance of regular health check-ups.  INSTRUCTIONS:  Please follow up with [place] to review the results of your vaginal swab. We will adjust your treatment plan based on these results if necessary.

## 2023-08-30 NOTE — Assessment & Plan Note (Signed)
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 Reports improved mental clarity and mood, no current cravings. Discussed the importance of maintaining sobriety and the risks of relapse, including potential overdose. Emphasized the need for support systems and self-reflection. Encouraged journaling and use of the [employer] for community support and accountability. - Encourage continued use of support systems such as the [employer] - Provide emotional support and encouragement - Discuss the importance of journaling and self-reflection - Advise on the risks of relapse and overdose

## 2023-08-30 NOTE — Assessment & Plan Note (Signed)
Well controlled at this time with no medication use. Active counseling/treatment for SAD in remission. Monitor closely.

## 2023-08-30 NOTE — Progress Notes (Signed)
Tollie Eth, DNP, AGNP-c [facility] [address] [place], [place] [address] [phone]   ACUTE VISIT- ESTABLISHED PATIENT  Blood pressure 124/82, pulse 60, weight 291 lb (132 kg), last menstrual period 08/03/2023.  Subjective:  HPI Belinda Brady is a 28 y.o. female presents to day for evaluation of acute concern(s).   History of Present Illness The patient is a 28 year old female who presents with urinary symptoms and concerns about a possible UTI or STD.  She has been experiencing a nagging sensation in her lower abdomen and a change in the odor of her urine for the past few weeks. There is no burning sensation during urination, which she typically experiences with UTIs. Laboratory tests show elevated white blood cell count and urinalysis indicating nitrites and leukocytes.  She has not had any new sexual partners but has engaged in sexual activity with existing partners, not using protection. She has not been tested for STDs since these encounters and wants to be tested. She notes some vaginal discharge but is uncertain if it is abnormal, as it does not have an unusual odor. No vaginal bleeding.  She mentions experiencing some mild lower back pain.  She is currently in treatment for substance use issues, specifically fentanyl, and has been clean for 25 days. She describes significant improvements in her mental clarity and mood since starting treatment. 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 She is hopeful and eager to stay sober and live her life in a positive and healthy fashion.  ROS negative except for what is listed in HPI. History, Medications, Surgery, SDOH, and Family History reviewed and updated as appropriate.  Objective:  Physical Exam Vitals and nursing note reviewed.  Constitutional:      Appearance: Normal  appearance.  HENT:     Head: Normocephalic.  Cardiovascular:     Rate and Rhythm: Normal rate and regular rhythm.     Pulses: Normal pulses.  Pulmonary:     Effort: Pulmonary effort is normal.     Breath sounds: Normal breath sounds.  Skin:    General: Skin is warm and dry.     Capillary Refill: Capillary refill takes less than 2 seconds.  Neurological:     General: No focal deficit present.     Mental Status: She is alert and oriented to person, place, and time.         Assessment & Plan:   Problem List Items Addressed This Visit     Cystitis - Primary   Presents with lower abdominal discomfort, malodorous urine, and elevated WBC with nitrites and leukocytes in the urine, consistent with UTI. No dysuria reported, which is atypical but not uncommon. Differential includes possible STD due to overlapping symptoms. Discussed the importance of STD testing to rule out co-infection. Treatment will cover both UTI and potential STD components. - Start antibiotic treatment for UTI - Perform vaginal swab to test for yeast infection, bacterial vaginosis, gonorrhea, and chlamydia - Follow up with swab results to determine further treatment if necessary      Relevant Medications   nitrofurantoin, macrocrystal-monohydrate, (MACROBID) 100 MG capsule   Vaginal discharge   Has not been tested for STDs since her last sexual encounter. Focused on maintaining a healthy lifestyle for her family, including a nine-year-old child. Discussed the importance of regular health check-ups and screenings. 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 Reports improved mental clarity and  mood, no current cravings. Discussed the importance of maintaining sobriety and the risks of relapse, including potential overdose. Emphasized the need for support systems and self-reflection. Encouraged journaling and use of the [employer] for community support and accountability. - Encourage continued use of support systems such as the [employer] - Provide emotional support and encouragement - Discuss the importance of journaling and self-reflection - Advise on the risks of relapse and overdose      Moderate episode of recurrent major depressive disorder (HCC)   Well controlled at this time with no medication use. Active counseling/treatment for SAD in remission. Monitor closely.       Other Visit Diagnoses       Lower abdominal pain       Relevant Orders   POCT URINALYSIS DIP (CLINITEK) (Completed)   Urine Culture     Bad odor of urine             Tollie Eth, DNP, AGNP-c

## 2023-09-03 LAB — NUSWAB VG PLUS+MYCOPLASMAS,NAA
Mycoplasma hominis NAA: POSITIVE — AB
Ureaplasma spp NAA: POSITIVE — AB

## 2023-09-03 LAB — URINE CULTURE

## 2023-09-05 ENCOUNTER — Other Ambulatory Visit: Payer: Self-pay | Admitting: Nurse Practitioner

## 2023-09-05 ENCOUNTER — Encounter: Payer: Self-pay | Admitting: Nurse Practitioner

## 2023-09-05 DIAGNOSIS — N760 Acute vaginitis: Secondary | ICD-10-CM

## 2023-09-05 DIAGNOSIS — A493 Mycoplasma infection, unspecified site: Secondary | ICD-10-CM

## 2023-09-05 DIAGNOSIS — B9689 Other specified bacterial agents as the cause of diseases classified elsewhere: Secondary | ICD-10-CM

## 2023-09-05 MED ORDER — AZITHROMYCIN 250 MG PO TABS: ORAL_TABLET | ORAL | 0 refills | Status: AC

## 2023-09-05 MED ORDER — METRONIDAZOLE 500 MG PO TABS: 500.0000 mg | ORAL_TABLET | Freq: Two times a day (BID) | ORAL | 0 refills | Status: DC

## 2023-10-27 ENCOUNTER — Encounter: Payer: Self-pay | Admitting: Nurse Practitioner

## 2024-03-13 ENCOUNTER — Encounter: Payer: Self-pay | Admitting: Nurse Practitioner

## 2024-03-13 ENCOUNTER — Ambulatory Visit (INDEPENDENT_AMBULATORY_CARE_PROVIDER_SITE_OTHER): Admitting: Nurse Practitioner

## 2024-03-13 VITALS — BP 124/80 | HR 68 | Ht 66.5 in | Wt 326.2 lb

## 2024-03-13 DIAGNOSIS — E66813 Obesity, class 3: Secondary | ICD-10-CM | POA: Diagnosis not present

## 2024-03-13 DIAGNOSIS — R6889 Other general symptoms and signs: Secondary | ICD-10-CM | POA: Diagnosis not present

## 2024-03-13 DIAGNOSIS — Z6841 Body Mass Index (BMI) 40.0 and over, adult: Secondary | ICD-10-CM | POA: Diagnosis not present

## 2024-03-13 NOTE — Assessment & Plan Note (Signed)
  1. Class 3 severe obesity due to excess calories with serious comorbidity and body mass index (BMI) of 50.0 to 59.9 in adult - Hemoglobin A1c - CBC with Differential/Platelet - Comprehensive metabolic panel with GFR - T4, free - TSH - Lipid panel  2. Morbid obesity (HCC) - Hemoglobin A1c - CBC with Differential/Platelet - Comprehensive metabolic panel with GFR - T4, free - TSH - Lipid panel  3. Decreased activity - Hemoglobin A1c - CBC with Differential/Platelet - Comprehensive metabolic panel with GFR - T4, free - TSH - Lipid panel

## 2024-03-13 NOTE — Patient Instructions (Addendum)
 Goals for the next 7 days:  - Give up one soda a day - Commit to exercise at least 10 minutes for 3 of the 7 days  Each week I would like you to send me your two goals and let me know if you met your goals from the week.    WEIGHT LOSS PLANNING Your progress today shows:     03/13/2024    3:48 PM 08/30/2023    3:05 PM 10/06/2022    8:10 PM  Vitals with BMI  Height 5' 6.5    Weight 326 lbs 3 oz 291 lbs   BMI 51.87    Systolic 124 124 869  Diastolic 80 82 102  Pulse 68 60 96    For best management of weight, it is vital to balance intake versus output. This means the number of calories burned per day must be less than the calories you take in with food and drink.   I recommend trying to follow a diet with the following: Calories: 1800-2000 calories per day Carbohydrates: 150-180 grams of carbohydrates per day  Why: Gives your body enough quick fuel for cells to maintain normal function without sending them into starvation mode.  Protein: At least 90 grams of protein per day- 30 grams with each meal Why: Protein takes longer and uses more energy than carbohydrates to break down for fuel. The carbohydrates in your meals serves as quick energy sources and proteins help use some of that extra quick energy to break down to produce long term energy. This helps you not feel hungry as quickly and protein breakdown burns calories.  Water: Drink AT LEAST 64 ounces of water per day  Why: Water is essential to healthy metabolism. Water helps to fill the stomach and keep you fuller longer. Water is required for healthy digestion and filtering of waste in the body.  Fat: Limit fats in your diet- when choosing fats, choose foods with lower fats content such as lean meats (chicken, fish, [place]).  Why: Increased fat intake leads to storage for later. Once you burn your carbohydrate energy, your body goes into fat and protein breakdown mode to help you loose weight.  Cholesterol: Fats and oils that  are LIQUID at room temperature are best. Choose vegetable oils (olive oil, avocado oil, nuts). Avoid fats that are SOLID at room temperature (animal fats, processed meats). Healthy fats are often found in whole grains, beans, nuts, seeds, and berries.  Why: Elevated cholesterol levels lead to build up of cholesterol on the inside of your blood vessels. This will eventually cause the blood vessels to become hard and can lead to high blood pressure and damage to your organs. When the blood flow is reduced, but the pressure is high from cholesterol buildup, parts of the cholesterol can break off and form clots that can go to the brain or heart leading to a stroke or heart attack.  Fiber: Increase amount of SOLUBLE the fiber in your diet. This helps to fill you up, lowers cholesterol, and helps with digestion. Some foods high in soluble fiber are oats, peas, beans, apples, carrots, barley, and citrus fruits.   Why: Fiber fills you up, helps remove excess cholesterol, and aids in healthy digestion which are all very important in weight management.   I recommend the following as a minimum activity routine: Purposeful walk or other physical activity at least 20 minutes every single day. This means purposefully taking a walk, jog, bike, swim, treadmill, elliptical, dance, etc.  This activity should be ABOVE your normal daily activities, such as walking at work. Goal exercise should be at least 150 minutes a week- work your way up to this.   Heart Rate: Your maximum exercise heart rate should be 220 - Your Age in Years. When exercising, get your heart rate up, but avoid going over the maximum targeted heart rate.  60-70% of your maximum heart rate is where you tend to burn the most fat. To find this number:  220 - Age In Years= Max HR  Max HR x 0.6 (or 0.7) = Fat Burning HR The Fat Burning HR is your goal heart rate while working out to burn the most fat.  NEVER exercise to the point your feel lightheaded,  weak, nauseated, dizzy. If you experience ANY of these symptoms- STOP exercise! Allow yourself to cool down and your heart rate to come down. Then restart slower next time.  If at ANY TIME you feel chest pain or chest pressure during exercise, STOP IMMEDIATELY and seek medical attention.

## 2024-03-13 NOTE — Progress Notes (Unsigned)
 Catheline Doing, DNP, AGNP-c [facility]  [address] [place], [place] [address] [phone]  ESTABLISHED PATIENT- Chronic Health and/or Follow-Up Visit  Blood pressure 124/80, pulse 68, height 5' 6.5 (1.689 m), weight (!) 326 lb 3.2 oz (148 kg), last menstrual period 03/06/2024.   HPI: History of Present Illness Belinda Brady is a 28 year old female who presents to discuss weight loss options.  She has experienced lifelong weight fluctuations, with her weight currently at 325 pounds. Her weight has varied from 270 to 340 pounds in adulthood, with a peak of 370 pounds in the past. She reports that her weight gain is related to difficulty sustaining motivation and reverting to unhealthy eating habits after periods of successful weight loss.  Her previous weight loss strategies included regular exercise, such as walking a mile at least three days a week, and dietary changes like eliminating sugary drinks and junk food. She prefers natural weight loss methods without medication but struggles with maintaining these changes long-term, often losing motivation and reverting to previous habits.  She is interested in exploring bariatric surgery and medication as potential weight loss options. She is also considering consulting a weight management specialist and emphasizes a desire to make sustainable lifestyle changes rather than temporary diets.  Her social history includes working from home, which impacts her physical activity levels. She has a daughter and is motivated to set a healthy example for her. 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  In terms of family history, there is a prevalence of diabetes and other health issues, which she is keen to avoid by managing her weight effectively. She acknowledges the potential for future health risks due to her weight.  All ROS negative with exception of what is  listed above.    PHYSICAL EXAM Physical Exam Vitals and nursing note reviewed.  Constitutional:      Appearance: Normal appearance. She is obese.  HENT:     Head: Normocephalic.  Eyes:     Pupils: Pupils are equal, round, and reactive to light.  Cardiovascular:     Rate and Rhythm: Normal rate and regular rhythm.     Pulses: Normal pulses.     Heart sounds: Normal heart sounds.  Pulmonary:     Effort: Pulmonary effort is normal.     Breath sounds: Normal breath sounds.  Abdominal:     General: Bowel sounds are normal.     Palpations: Abdomen is soft.  Musculoskeletal:        General: Normal range of motion.     Cervical back: Normal range of motion.  Skin:    General: Skin is warm.     Capillary Refill: Capillary refill takes less than 2 seconds.  Neurological:     General: No focal deficit present.     Mental Status: She is alert and oriented to person, place, and time.  Psychiatric:        Mood and Affect: Mood normal.     PLAN Problem List Items Addressed This Visit     Morbid obesity (HCC)    1. Class 3 severe obesity due to excess calories with serious comorbidity and body mass index (BMI) of 50.0 to 59.9 in adult - Hemoglobin A1c - CBC with Differential/Platelet - Comprehensive metabolic panel with GFR - T4, free - TSH - Lipid panel  2. Morbid obesity (HCC) - Hemoglobin A1c - CBC with Differential/Platelet - Comprehensive metabolic panel with GFR -  T4, free - TSH - Lipid panel  3. Decreased activity - Hemoglobin A1c - CBC with Differential/Platelet - Comprehensive metabolic panel with GFR - T4, free - TSH - Lipid panel       Relevant Orders   Hemoglobin A1c (Completed)   CBC with Differential/Platelet (Completed)   Comprehensive metabolic panel with GFR (Completed)   T4, free (Completed)   TSH (Completed)   Lipid panel (Completed)   Other Visit Diagnoses       Class 3 severe obesity due to excess calories with serious comorbidity and  body mass index (BMI) of 50.0 to 59.9 in adult    -  Primary   Relevant Orders   Hemoglobin A1c (Completed)   CBC with Differential/Platelet (Completed)   Comprehensive metabolic panel with GFR (Completed)   T4, free (Completed)   TSH (Completed)   Lipid panel (Completed)     Decreased activity       Relevant Orders   Hemoglobin A1c (Completed)   CBC with Differential/Platelet (Completed)   Comprehensive metabolic panel with GFR (Completed)   T4, free (Completed)   TSH (Completed)   Lipid panel (Completed)       Obesity Chronic obesity with weight cycling. Current weight is 325 lbs, previously 340 lbs, with a weight loss to 270 lbs. Discussed challenges of maintaining weight loss and emphasized sustainable lifestyle changes over quick fixes. Explored bariatric surgery and medications as adjuncts to diet and exercise. Highlighted the importance of a diabetic diet, moderation, and setting realistic goals. Encouraged gradual weight loss of 1.5 to 2 lbs per week to avoid starvation response. Discussed potential weight regain with medication cessation and the necessity of long-term lifestyle changes. - Order labs to check A1c and thyroid function. - Provide handouts on meal options and healthy eating. - Encourage reducing soda intake by one per day, replacing with water. - Advise committing to at least 10 minutes of exercise, three days a week. - Discuss potential referral to a bariatric surgeon for consultation if interested. - Schedule follow-up in one month to review progress and lab results.  Return in about 4 weeks (around 04/10/2024) for Virtual Weight.  SaraBeth Alleta Avery, DNP, AGNP-c Time: 44 minutes, >50% spent counseling, care coordination, chart review, and documentation.

## 2024-03-14 LAB — COMPREHENSIVE METABOLIC PANEL WITH GFR
ALT: 8 IU/L (ref 0–32)
AST: 13 IU/L (ref 0–40)
Albumin: 4 g/dL (ref 4.0–5.0)
Alkaline Phosphatase: 76 IU/L (ref 44–121)
BUN/Creatinine Ratio: 11 (ref 9–23)
BUN: 9 mg/dL (ref 6–20)
Bilirubin Total: <0.2 mg/dL (ref 0.0–1.2)
CO2: 22 mmol/L (ref 20–29)
Calcium: 9.2 mg/dL (ref 8.7–10.2)
Chloride: 102 mmol/L (ref 96–106)
Creatinine, Ser: 0.84 mg/dL (ref 0.57–1.00)
Globulin, Total: 2.6 g/dL (ref 1.5–4.5)
Glucose: 96 mg/dL (ref 70–99)
Potassium: 4 mmol/L (ref 3.5–5.2)
Sodium: 137 mmol/L (ref 134–144)
Total Protein: 6.6 g/dL (ref 6.0–8.5)
eGFR: 97 mL/min/1.73 (ref >59)

## 2024-03-14 LAB — CBC WITH DIFFERENTIAL/PLATELET
Basophils Absolute: 0 x10E3/uL (ref 0.0–0.2)
Basos: 0 %
EOS (ABSOLUTE): 0.4 x10E3/uL (ref 0.0–0.4)
Eos: 5 %
Hematocrit: 39.1 % (ref 34.0–46.6)
Hemoglobin: 12.3 g/dL (ref 11.1–15.9)
Immature Grans (Abs): 0 x10E3/uL (ref 0.0–0.1)
Immature Granulocytes: 0 %
Lymphocytes Absolute: 4.3 x10E3/uL — ABNORMAL HIGH (ref 0.7–3.1)
Lymphs: 44 %
MCH: 28.9 pg (ref 26.6–33.0)
MCHC: 31.5 g/dL (ref 31.5–35.7)
MCV: 92 fL (ref 79–97)
Monocytes Absolute: 0.7 x10E3/uL (ref 0.1–0.9)
Monocytes: 7 %
Neutrophils Absolute: 4.3 x10E3/uL (ref 1.4–7.0)
Neutrophils: 44 %
Platelets: 152 x10E3/uL (ref 150–450)
RBC: 4.26 x10E6/uL (ref 3.77–5.28)
RDW: 13 % (ref 11.7–15.4)
WBC: 9.8 x10E3/uL (ref 3.4–10.8)

## 2024-03-14 LAB — LIPID PANEL
Chol/HDL Ratio: 5.2 ratio — ABNORMAL HIGH (ref 0.0–4.4)
Cholesterol, Total: 170 mg/dL (ref 100–199)
HDL: 33 mg/dL — ABNORMAL LOW
LDL Chol Calc (NIH): 96 mg/dL (ref 0–99)
Triglycerides: 240 mg/dL — ABNORMAL HIGH (ref 0–149)
VLDL Cholesterol Cal: 41 mg/dL — ABNORMAL HIGH (ref 5–40)

## 2024-03-14 LAB — HEMOGLOBIN A1C
Est. average glucose Bld gHb Est-mCnc: 103 mg/dL
Hgb A1c MFr Bld: 5.2 % (ref 4.8–5.6)

## 2024-03-14 LAB — T4, FREE: Free T4: 1.07 ng/dL (ref 0.82–1.77)

## 2024-03-14 LAB — TSH: TSH: 2.55 u[IU]/mL (ref 0.450–4.500)

## 2024-03-18 ENCOUNTER — Encounter: Payer: Self-pay | Admitting: Nurse Practitioner

## 2024-03-19 ENCOUNTER — Ambulatory Visit: Payer: Self-pay | Admitting: Nurse Practitioner

## 2024-04-02 ENCOUNTER — Ambulatory Visit: Admitting: Nurse Practitioner

## 2024-04-14 ENCOUNTER — Other Ambulatory Visit: Payer: Self-pay

## 2024-04-14 DIAGNOSIS — Z6841 Body Mass Index (BMI) 40.0 and over, adult: Secondary | ICD-10-CM

## 2024-04-14 DIAGNOSIS — R0683 Snoring: Secondary | ICD-10-CM

## 2024-04-14 MED ORDER — FLUCONAZOLE 150 MG PO TABS: 150.0000 mg | ORAL_TABLET | Freq: Once | ORAL | 2 refills | Status: AC
